# Patient Record
Sex: Female | Born: 1935 | Race: White | Hispanic: No | Smoking: Former smoker
Health system: Southern US, Community
[De-identification: ages and names within clinical notes are randomized; demographics above are authoritative.]

## PROBLEM LIST (undated history)

## (undated) DIAGNOSIS — F03A0 Unspecified dementia, mild, without behavioral disturbance, psychotic disturbance, mood disturbance, and anxiety: Secondary | ICD-10-CM

## (undated) DIAGNOSIS — M1990 Unspecified osteoarthritis, unspecified site: Secondary | ICD-10-CM

## (undated) DIAGNOSIS — Z952 Presence of prosthetic heart valve: Secondary | ICD-10-CM

## (undated) DIAGNOSIS — E785 Hyperlipidemia, unspecified: Secondary | ICD-10-CM

## (undated) DIAGNOSIS — I10 Essential (primary) hypertension: Secondary | ICD-10-CM

## (undated) DIAGNOSIS — E039 Hypothyroidism, unspecified: Secondary | ICD-10-CM

## (undated) DIAGNOSIS — F0390 Unspecified dementia without behavioral disturbance: Secondary | ICD-10-CM

## (undated) HISTORY — PX: ABDOMINAL HYSTERECTOMY: SUR658

## (undated) HISTORY — DX: Essential (primary) hypertension: I10

## (undated) HISTORY — DX: Hyperlipidemia, unspecified: E78.5

## (undated) HISTORY — DX: Unspecified osteoarthritis, unspecified site: M19.90

---

## 2002-11-03 HISTORY — PX: CARDIAC SURGERY: SHX584

## 2017-06-12 ENCOUNTER — Ambulatory Visit (INDEPENDENT_AMBULATORY_CARE_PROVIDER_SITE_OTHER): Payer: PRIVATE HEALTH INSURANCE | Admitting: Urology

## 2017-06-12 ENCOUNTER — Encounter: Payer: Self-pay | Admitting: Urology

## 2017-06-12 VITALS — BP 165/104 | HR 85 | Ht 65.0 in | Wt 120.3 lb

## 2017-06-12 DIAGNOSIS — R339 Retention of urine, unspecified: Secondary | ICD-10-CM | POA: Diagnosis not present

## 2017-06-12 LAB — URINALYSIS, COMPLETE
BILIRUBIN UA: NEGATIVE
GLUCOSE, UA: NEGATIVE
KETONES UA: NEGATIVE
Nitrite, UA: POSITIVE — AB
PH UA: 7 (ref 5.0–7.5)
SPEC GRAV UA: 1.02 (ref 1.005–1.030)
UUROB: 0.2 mg/dL (ref 0.2–1.0)

## 2017-06-12 LAB — MICROSCOPIC EXAMINATION: Epithelial Cells (non renal): NONE SEEN /hpf (ref 0–10)

## 2017-06-12 NOTE — Progress Notes (Signed)
06/12/2017 3:06 PM   Jimmye NormanLarue Rebman 10/31/1921 [id number]  Referring provider: No referring provider defined for this encounter.  CC: Urinary retention  HPI: This is a 83 year old female who recently moved to [place]. She underwent foot surgery April 2018 and was evidently in retention postop. The son is not sure if she's had voiding trials and we don't have any of the records. She is tolerating the Foley well. She has no prior GU history or surgery. NG risk includes neuropathy in her legs and she uses a walker.   Modifying factors: There are no other modifying factors  Associated signs and symptoms: There are no other associated signs and symptoms Aggravating and relieving factors: There are no other aggravating or relieving factors Severity: Moderate Duration: Persistent   PMH: Past Medical History:  Diagnosis Date  . Arthritis   . Hyperlipidemia   . Hypertension     Surgical History: Past Surgical History:  Procedure Laterality Date  . CARDIAC SURGERY      Home Medications:  Allergies as of 06/12/2017      Reactions   Keflex [cephalexin]       Medication List       Accurate as of 06/12/17  3:06 PM. Always use your most recent med list.          acetaminophen 500 MG tablet Commonly known as:  TYLENOL Take 500 mg by mouth every 6 (six) hours as needed.   amLODipine 2.5 MG tablet Commonly known as:  NORVASC Take 2.5 mg by mouth daily.   aspirin EC 81 MG tablet Take 81 mg by mouth daily.   bisacodyl 10 MG suppository Commonly known as:  DULCOLAX Place 10 mg rectally as needed for moderate constipation.   calcium-vitamin D 500-200 MG-UNIT tablet Commonly known as:  OSCAL WITH D Take 1 tablet by mouth.   carvedilol 6.25 MG tablet Commonly known as:  COREG Take 6.25 mg by mouth 2 (two) times daily with a meal.   cyclobenzaprine 5 MG tablet Commonly known as:  FLEXERIL Take 5 mg by mouth 3 (three) times daily as needed for muscle spasms.     DERMACLOUD Crea Apply topically.   ergocalciferol 50000 units capsule Commonly known as:  VITAMIN D2 Take 50,000 Units by mouth once a week.   HYDROcodone-acetaminophen 7.5-325 MG tablet Commonly known as:  NORCO Take 1 tablet by mouth every 6 (six) hours as needed for moderate pain.   levothyroxine 88 MCG tablet Commonly known as:  SYNTHROID, LEVOTHROID Take 88 mcg by mouth daily before breakfast.   magnesium hydroxide 400 MG/5ML suspension Commonly known as:  MILK OF MAGNESIA Take by mouth daily as needed for mild constipation.   Melatonin 3 MG Tabs Take by mouth.   nystatin powder Generic drug:  nystatin Apply topically 4 (four) times daily.   pravastatin 40 MG tablet Commonly known as:  PRAVACHOL Take 40 mg by mouth daily.   senna 8.6 MG tablet Commonly known as:  SENOKOT Take 1 tablet by mouth daily.   vitamin C 500 MG tablet Commonly known as:  ASCORBIC ACID Take 500 mg by mouth daily.       Allergies:  Allergies  Allergen Reactions  . Keflex [Cephalexin]     Family History: Family History  Problem Relation Age of Onset  . Prostate cancer Neg Hx   . Bladder Cancer Neg Hx   . Kidney cancer Neg Hx     Social History:  reports that she has quit smoking. She  has never used smokeless tobacco. She reports that she does not drink alcohol or use drugs.  ROS: UROLOGY Frequent Urination?: Yes Hard to postpone urination?: Yes Burning/pain with urination?: Yes Get up at night to urinate?: Yes Leakage of urine?: No Urine stream starts and stops?: No Trouble starting stream?: No Do you have to strain to urinate?: No Blood in urine?: No Urinary tract infection?: No Sexually transmitted disease?: No Injury to kidneys or bladder?: No Painful intercourse?: No Weak stream?: No Currently pregnant?: No Vaginal bleeding?: No Last menstrual period?: n  Gastrointestinal Nausea?: No Vomiting?: No Indigestion/heartburn?: No Diarrhea?: No Constipation?:  No  Constitutional Fever: No Night sweats?: No Weight loss?: Yes Fatigue?: No  Skin Skin rash/lesions?: No Itching?: No  Eyes Blurred vision?: No Double vision?: No  Ears/Nose/Throat Sore throat?: No Sinus problems?: No  Hematologic/Lymphatic Swollen glands?: No Easy bruising?: Yes  Cardiovascular Leg swelling?: Yes Chest pain?: No  Respiratory Cough?: No Shortness of breath?: No  Endocrine Excessive thirst?: No  Musculoskeletal Back pain?: No Joint pain?: Yes  Neurological Headaches?: No Dizziness?: No  Psychologic Depression?: No Anxiety?: Yes  Physical Exam: BP (!) 165/104 (BP Location: Left Arm, Patient Position: Sitting, Cuff Size: Normal)   Pulse 85   Ht 5\' 5"  (1.651 m)   Wt 54.6 kg (120 lb 4.8 oz)   BMI 20.02 kg/m   Constitutional:  Alert and oriented, No acute distress. HEENT: [place] AT, moist mucus membranes.  Trachea midline, no masses. Cardiovascular: No clubbing, cyanosis, or edema. Respiratory: Normal respiratory effort, no increased work of breathing. GI: Abdomen is soft, nontender, nondistended, no abdominal masses GU: No CVA tenderness. Skin: No rashes, bruises or suspicious lesions. Neurologic: Grossly intact, no focal deficits, moving all 4 extremities. Psychiatric: Normal mood and affect.  Laboratory Data: No results found for: WBC, HGB, HCT, MCV, PLT  No results found for: CREATININE  No results found for: PSA  No results found for: TESTOSTERONE  No results found for: HGBA1C  Urinalysis No results found for: COLORURINE, APPEARANCEUR, LABSPEC, PHURINE, GLUCOSEU, HGBUR, BILIRUBINUR, KETONESUR, PROTEINUR, UROBILINOGEN, NITRITE, LEUKOCYTESUR   Assessment & Plan:    Urinary retention - urine sent for cx so we can plan what abx to give when we do a void trial. We'll see her back in the morning in 4-6 days for a voiding trial. We're trying to get records from [facility].   There are no diagnoses linked to this  encounter.  No Follow-up on file.  Jerilee Field, MD  [facility] [address], [address] [place], [place] [address] [phone]

## 2017-06-15 LAB — CULTURE, URINE COMPREHENSIVE

## 2017-06-16 NOTE — Progress Notes (Signed)
06/17/2017 9:44 AM   Sheri Webster 1920-11-26 [id number]  Referring provider: No referring provider defined for this encounter.  CC: Urinary retention  HPI: This is a 83 year old female who recently moved to [place]. She underwent foot surgery April 2018 and was evidently in retention postop. The son is not sure if she's had voiding trials and we don't have any of the records. She is tolerating the Foley well. She has no prior GU history or surgery. NG risk includes neuropathy in her legs and she uses a walker.   She is here with her son, [name].   Modifying factors: There are no other modifying factors  Associated signs and symptoms: There are no other associated signs and symptoms Aggravating and relieving factors: There are no other aggravating or relieving factors Severity: Moderate Duration: Persistent  Today, she presents for a TOV.   Urine culture from her visit on 06/12/2017 grew out greater than 3 organisms.  She is having nocturia and urinary tract infection as her complaint today.  PMH: Past Medical History:  Diagnosis Date  . Arthritis   . Hyperlipidemia   . Hypertension     Surgical History: Past Surgical History:  Procedure Laterality Date  . ABDOMINAL HYSTERECTOMY    . CARDIAC SURGERY  2004   heart valve replacement    Home Medications:  Allergies as of 06/17/2017      Reactions   Keflex [cephalexin]       Medication List       Accurate as of 06/17/17  9:44 AM. Always use your most recent med list.          acetaminophen 500 MG tablet Commonly known as:  TYLENOL Take 500 mg by mouth every 6 (six) hours as needed.   amLODipine 2.5 MG tablet Commonly known as:  NORVASC Take 2.5 mg by mouth daily.   aspirin EC 81 MG tablet Take 81 mg by mouth daily.   bisacodyl 10 MG suppository Commonly known as:  DULCOLAX Place 10 mg rectally as needed for moderate constipation.   calcium-vitamin D 500-200 MG-UNIT tablet Commonly known as:  OSCAL  WITH D Take 1 tablet by mouth.   carvedilol 6.25 MG tablet Commonly known as:  COREG Take 6.25 mg by mouth 2 (two) times daily with a meal.   cyclobenzaprine 5 MG tablet Commonly known as:  FLEXERIL Take 5 mg by mouth 3 (three) times daily as needed for muscle spasms.   DERMACLOUD Crea Apply topically.   ergocalciferol 50000 units capsule Commonly known as:  VITAMIN D2 Take 50,000 Units by mouth once a week.   HYDROcodone-acetaminophen 7.5-325 MG tablet Commonly known as:  NORCO Take 1 tablet by mouth every 6 (six) hours as needed for moderate pain.   levothyroxine 88 MCG tablet Commonly known as:  SYNTHROID, LEVOTHROID Take 88 mcg by mouth daily before breakfast.   magnesium hydroxide 400 MG/5ML suspension Commonly known as:  MILK OF MAGNESIA Take by mouth daily as needed for mild constipation.   Melatonin 3 MG Tabs Take by mouth.   nystatin powder Generic drug:  nystatin Apply topically 4 (four) times daily.   pravastatin 40 MG tablet Commonly known as:  PRAVACHOL Take 40 mg by mouth daily.   senna 8.6 MG tablet Commonly known as:  SENOKOT Take 1 tablet by mouth daily.   traZODone 50 MG tablet Commonly known as:  DESYREL Take 50 mg by mouth at bedtime.   vitamin C 500 MG tablet Commonly known as:  ASCORBIC  ACID Take 500 mg by mouth daily.       Allergies:  Allergies  Allergen Reactions  . Keflex [Cephalexin]     Family History: Family History  Problem Relation Age of Onset  . Bladder Cancer Neg Hx   . Kidney cancer Neg Hx     Social History:  reports that she quit smoking about 21 years ago. She has never used smokeless tobacco. She reports that she does not drink alcohol or use drugs.  ROS: UROLOGY Frequent Urination?: No Hard to postpone urination?: No Burning/pain with urination?: No Get up at night to urinate?: Yes Leakage of urine?: No Urine stream starts and stops?: No Trouble starting stream?: No Do you have to strain to  urinate?: No Blood in urine?: No Urinary tract infection?: Yes Sexually transmitted disease?: No Injury to kidneys or bladder?: No Painful intercourse?: No Weak stream?: No Currently pregnant?: No Vaginal bleeding?: No Last menstrual period?: n  Gastrointestinal Nausea?: No Vomiting?: No Indigestion/heartburn?: No Diarrhea?: No Constipation?: No  Constitutional Fever: No Night sweats?: No Weight loss?: No Fatigue?: No  Skin Skin rash/lesions?: No Itching?: No  Eyes Blurred vision?: No Double vision?: No  Ears/Nose/Throat Sore throat?: No Sinus problems?: No  Hematologic/Lymphatic Swollen glands?: No Easy bruising?: No  Cardiovascular Leg swelling?: No Chest pain?: No  Respiratory Cough?: No Shortness of breath?: No  Endocrine Excessive thirst?: No  Musculoskeletal Back pain?: No Joint pain?: No  Neurological Headaches?: No Dizziness?: No  Psychologic Depression?: No Anxiety?: No  Physical Exam: BP (!) 167/111   Pulse 69   Ht 5\' 2"  (1.575 m)   Wt 120 lb 4.8 oz (54.6 kg)   BMI 22.00 kg/m   Constitutional:  Alert and oriented, No acute distress. HEENT: [place] AT, moist mucus membranes.  Trachea midline, no masses. Cardiovascular: No clubbing, cyanosis, or edema. Respiratory: Normal respiratory effort, no increased work of breathing. GI: Abdomen is soft, nontender, nondistended, no abdominal masses GU: No CVA tenderness. Skin: No rashes, bruises or suspicious lesions. Neurologic: Grossly intact, no focal deficits, moving all 4 extremities. Psychiatric: Normal mood and affect.  Laboratory Data:  Urinalysis   Assessment & Plan:    Urinary retention - urine sent for cx so we can plan what abx to give when we do a void trial. We'll see her back in the morning in 4-6 days for a voiding trial. We're trying to get records from [facility].   1. Acute urinary retention:     - foley catheter removed  -voiding trial today    -return if unable  to urinate or experiencing suprapubic discomfort  -follow-up in one month for OAB questionnaire and PVR  - We'll request records from [facility]   Return in about 1 month (around 07/18/2017) for PVR and OAB questionnaire.  Michiel CowboySHANNON Webb Weed, PA-C  [facility] [address], [address] [place], [place] [id number] [phone]

## 2017-06-17 ENCOUNTER — Ambulatory Visit (INDEPENDENT_AMBULATORY_CARE_PROVIDER_SITE_OTHER): Payer: PRIVATE HEALTH INSURANCE | Admitting: Urology

## 2017-06-17 ENCOUNTER — Encounter: Payer: Self-pay | Admitting: Urology

## 2017-06-17 VITALS — BP 167/111 | HR 69 | Ht 62.0 in | Wt 120.3 lb

## 2017-06-17 DIAGNOSIS — R339 Retention of urine, unspecified: Secondary | ICD-10-CM

## 2017-06-17 NOTE — Progress Notes (Signed)
Catheter Removal  Patient is present today for a catheter removal.  9ml of water was drained from the balloon. A 16FR foley cath was removed from the bladder no complications were noted . Patient tolerated well.  Preformed by: Dallas Schimkeamona Lakely [place]mendorf CMA  Follow up/ Additional notes: Return to office by 3:00 pm if can not void on own.

## 2017-06-17 NOTE — Progress Notes (Signed)
Medical Release for [facility] received and faxed.

## 2017-06-18 ENCOUNTER — Inpatient Hospital Stay: Payer: Medicare Other

## 2017-06-18 ENCOUNTER — Emergency Department: Payer: Medicare Other

## 2017-06-18 ENCOUNTER — Encounter: Payer: Self-pay | Admitting: Emergency Medicine

## 2017-06-18 ENCOUNTER — Inpatient Hospital Stay
Admission: EM | Admit: 2017-06-18 | Discharge: 2017-06-24 | DRG: 543 | Disposition: A | Discharge status: Skilled Nursing Facility | Payer: Medicare Other | Attending: Internal Medicine | Admitting: Internal Medicine

## 2017-06-18 DIAGNOSIS — Z515 Encounter for palliative care: Secondary | ICD-10-CM

## 2017-06-18 DIAGNOSIS — S0003XA Contusion of scalp, initial encounter: Secondary | ICD-10-CM | POA: Diagnosis present

## 2017-06-18 DIAGNOSIS — I10 Essential (primary) hypertension: Secondary | ICD-10-CM | POA: Diagnosis present

## 2017-06-18 DIAGNOSIS — Z953 Presence of xenogenic heart valve: Secondary | ICD-10-CM | POA: Diagnosis not present

## 2017-06-18 DIAGNOSIS — Z952 Presence of prosthetic heart valve: Secondary | ICD-10-CM | POA: Diagnosis not present

## 2017-06-18 DIAGNOSIS — R339 Retention of urine, unspecified: Secondary | ICD-10-CM | POA: Diagnosis present

## 2017-06-18 DIAGNOSIS — Z66 Do not resuscitate: Secondary | ICD-10-CM | POA: Diagnosis present

## 2017-06-18 DIAGNOSIS — E871 Hypo-osmolality and hyponatremia: Secondary | ICD-10-CM | POA: Diagnosis present

## 2017-06-18 DIAGNOSIS — R55 Syncope and collapse: Secondary | ICD-10-CM | POA: Diagnosis present

## 2017-06-18 DIAGNOSIS — K5900 Constipation, unspecified: Secondary | ICD-10-CM | POA: Diagnosis not present

## 2017-06-18 DIAGNOSIS — I248 Other forms of acute ischemic heart disease: Secondary | ICD-10-CM | POA: Diagnosis present

## 2017-06-18 DIAGNOSIS — W19XXXA Unspecified fall, initial encounter: Secondary | ICD-10-CM | POA: Diagnosis not present

## 2017-06-18 DIAGNOSIS — Z79899 Other long term (current) drug therapy: Secondary | ICD-10-CM | POA: Diagnosis not present

## 2017-06-18 DIAGNOSIS — M549 Dorsalgia, unspecified: Secondary | ICD-10-CM

## 2017-06-18 DIAGNOSIS — W06XXXA Fall from bed, initial encounter: Secondary | ICD-10-CM | POA: Diagnosis present

## 2017-06-18 DIAGNOSIS — E039 Hypothyroidism, unspecified: Secondary | ICD-10-CM | POA: Diagnosis present

## 2017-06-18 DIAGNOSIS — R748 Abnormal levels of other serum enzymes: Secondary | ICD-10-CM | POA: Diagnosis not present

## 2017-06-18 DIAGNOSIS — Z87891 Personal history of nicotine dependence: Secondary | ICD-10-CM

## 2017-06-18 DIAGNOSIS — E785 Hyperlipidemia, unspecified: Secondary | ICD-10-CM | POA: Diagnosis present

## 2017-06-18 DIAGNOSIS — I4891 Unspecified atrial fibrillation: Secondary | ICD-10-CM | POA: Diagnosis not present

## 2017-06-18 DIAGNOSIS — E860 Dehydration: Secondary | ICD-10-CM | POA: Diagnosis present

## 2017-06-18 DIAGNOSIS — Z7189 Other specified counseling: Secondary | ICD-10-CM | POA: Diagnosis not present

## 2017-06-18 DIAGNOSIS — R7989 Other specified abnormal findings of blood chemistry: Secondary | ICD-10-CM

## 2017-06-18 DIAGNOSIS — E876 Hypokalemia: Secondary | ICD-10-CM | POA: Diagnosis not present

## 2017-06-18 DIAGNOSIS — M545 Low back pain: Secondary | ICD-10-CM | POA: Diagnosis not present

## 2017-06-18 DIAGNOSIS — S0990XA Unspecified injury of head, initial encounter: Secondary | ICD-10-CM | POA: Diagnosis not present

## 2017-06-18 DIAGNOSIS — I351 Nonrheumatic aortic (valve) insufficiency: Secondary | ICD-10-CM | POA: Diagnosis not present

## 2017-06-18 DIAGNOSIS — R778 Other specified abnormalities of plasma proteins: Secondary | ICD-10-CM

## 2017-06-18 DIAGNOSIS — M4856XA Collapsed vertebra, not elsewhere classified, lumbar region, initial encounter for fracture: Principal | ICD-10-CM | POA: Diagnosis present

## 2017-06-18 DIAGNOSIS — M546 Pain in thoracic spine: Secondary | ICD-10-CM | POA: Diagnosis present

## 2017-06-18 DIAGNOSIS — M4854XA Collapsed vertebra, not elsewhere classified, thoracic region, initial encounter for fracture: Secondary | ICD-10-CM | POA: Diagnosis present

## 2017-06-18 LAB — BASIC METABOLIC PANEL
Anion gap: 11 (ref 5–15)
BUN: 14 mg/dL (ref 6–20)
CALCIUM: 9.5 mg/dL (ref 8.9–10.3)
CO2: 27 mmol/L (ref 22–32)
CREATININE: 0.69 mg/dL (ref 0.44–1.00)
Chloride: 91 mmol/L — ABNORMAL LOW (ref 101–111)
Glucose, Bld: 119 mg/dL — ABNORMAL HIGH (ref 65–99)
Potassium: 4.1 mmol/L (ref 3.5–5.1)
SODIUM: 129 mmol/L — AB (ref 135–145)

## 2017-06-18 LAB — CBC WITH DIFFERENTIAL/PLATELET
BASOS ABS: 0.1 10*3/uL (ref 0–0.1)
BASOS PCT: 1 %
Eosinophils Absolute: 0 10*3/uL (ref 0–0.7)
Eosinophils Relative: 0 %
HEMATOCRIT: 40.9 % (ref 35.0–47.0)
Hemoglobin: 14.4 g/dL (ref 12.0–16.0)
LYMPHS PCT: 4 %
Lymphs Abs: 0.6 10*3/uL — ABNORMAL LOW (ref 1.0–3.6)
MCH: 33.6 pg (ref 26.0–34.0)
MCHC: 35.1 g/dL (ref 32.0–36.0)
MCV: 95.6 fL (ref 80.0–100.0)
MONO ABS: 0.8 10*3/uL (ref 0.2–0.9)
Monocytes Relative: 5 %
NEUTROS ABS: 14.3 10*3/uL — AB (ref 1.4–6.5)
Neutrophils Relative %: 90 %
PLATELETS: 305 10*3/uL (ref 150–440)
RBC: 4.28 MIL/uL (ref 3.80–5.20)
RDW: 14.9 % — AB (ref 11.5–14.5)
WBC: 15.8 10*3/uL — AB (ref 3.6–11.0)

## 2017-06-18 LAB — URINALYSIS, COMPLETE (UACMP) WITH MICROSCOPIC
BILIRUBIN URINE: NEGATIVE
GLUCOSE, UA: NEGATIVE mg/dL
KETONES UR: NEGATIVE mg/dL
LEUKOCYTES UA: NEGATIVE
NITRITE: NEGATIVE
PH: 6 (ref 5.0–8.0)
PROTEIN: NEGATIVE mg/dL
SQUAMOUS EPITHELIAL / LPF: NONE SEEN
Specific Gravity, Urine: 1.002 — ABNORMAL LOW (ref 1.005–1.030)

## 2017-06-18 LAB — HEPATIC FUNCTION PANEL
ALBUMIN: 4.5 g/dL (ref 3.5–5.0)
ALT: 13 U/L — ABNORMAL LOW (ref 14–54)
AST: 34 U/L (ref 15–41)
Alkaline Phosphatase: 63 U/L (ref 38–126)
BILIRUBIN INDIRECT: 1.5 mg/dL — AB (ref 0.3–0.9)
Bilirubin, Direct: 0.5 mg/dL (ref 0.1–0.5)
TOTAL PROTEIN: 7.8 g/dL (ref 6.5–8.1)
Total Bilirubin: 2 mg/dL — ABNORMAL HIGH (ref 0.3–1.2)

## 2017-06-18 LAB — TROPONIN I
TROPONIN I: 0.04 ng/mL — AB (ref <0.03)
TROPONIN I: 0.04 ng/mL — AB (ref <0.03)

## 2017-06-18 MED ORDER — ASPIRIN EC 81 MG PO TBEC
81.0000 mg | DELAYED_RELEASE_TABLET | Freq: Two times a day (BID) | ORAL | Status: DC
  Administered 2017-06-18 – 2017-06-24 (×12): 81 mg via ORAL
  Filled 2017-06-18 (×12): qty 1

## 2017-06-18 MED ORDER — ACETAMINOPHEN 650 MG RE SUPP: 650.0000 mg | Freq: Four times a day (QID) | RECTAL | Status: DC | PRN

## 2017-06-18 MED ORDER — ONDANSETRON HCL 4 MG/2ML IJ SOLN: 4.0000 mg | Freq: Four times a day (QID) | INTRAMUSCULAR | Status: DC | PRN

## 2017-06-18 MED ORDER — ENOXAPARIN SODIUM 40 MG/0.4ML ~~LOC~~ SOLN
40.0000 mg | SUBCUTANEOUS | Status: DC
  Administered 2017-06-18 – 2017-06-23 (×6): 40 mg via SUBCUTANEOUS
  Filled 2017-06-18 (×6): qty 0.4

## 2017-06-18 MED ORDER — TRAZODONE HCL 50 MG PO TABS
25.0000 mg | ORAL_TABLET | Freq: Every day | ORAL | Status: DC
  Administered 2017-06-18 – 2017-06-23 (×6): 25 mg via ORAL
  Filled 2017-06-18 (×6): qty 1

## 2017-06-18 MED ORDER — DOCUSATE SODIUM 100 MG PO CAPS
100.0000 mg | ORAL_CAPSULE | Freq: Two times a day (BID) | ORAL | Status: DC
  Administered 2017-06-18 – 2017-06-24 (×12): 100 mg via ORAL
  Filled 2017-06-18 (×12): qty 1

## 2017-06-18 MED ORDER — AMLODIPINE BESYLATE 5 MG PO TABS
2.5000 mg | ORAL_TABLET | Freq: Every day | ORAL | Status: DC
  Administered 2017-06-19 – 2017-06-20 (×2): 2.5 mg via ORAL
  Filled 2017-06-18 (×2): qty 1

## 2017-06-18 MED ORDER — BISACODYL 5 MG PO TBEC
5.0000 mg | DELAYED_RELEASE_TABLET | Freq: Every day | ORAL | Status: DC | PRN
  Administered 2017-06-20 – 2017-06-23 (×2): 5 mg via ORAL
  Filled 2017-06-18 (×2): qty 1

## 2017-06-18 MED ORDER — ACETAMINOPHEN 325 MG PO TABS
650.0000 mg | ORAL_TABLET | Freq: Four times a day (QID) | ORAL | Status: DC | PRN
  Administered 2017-06-18 – 2017-06-19 (×3): 650 mg via ORAL
  Filled 2017-06-18 (×3): qty 2

## 2017-06-18 MED ORDER — PRAVASTATIN SODIUM 40 MG PO TABS
40.0000 mg | ORAL_TABLET | Freq: Every day | ORAL | Status: DC
Start: 2017-06-19 — End: 2017-06-24
  Administered 2017-06-19 – 2017-06-23 (×5): 40 mg via ORAL
  Filled 2017-06-18 (×5): qty 1

## 2017-06-18 MED ORDER — CARVEDILOL 6.25 MG PO TABS
6.2500 mg | ORAL_TABLET | Freq: Two times a day (BID) | ORAL | Status: DC
  Administered 2017-06-18 – 2017-06-20 (×4): 6.25 mg via ORAL
  Filled 2017-06-18 (×4): qty 1

## 2017-06-18 MED ORDER — SODIUM CHLORIDE 0.9 % IV SOLN
INTRAVENOUS | Status: DC
  Administered 2017-06-18 – 2017-06-19 (×2): via INTRAVENOUS

## 2017-06-18 MED ORDER — SODIUM CHLORIDE 0.9 % IV BOLUS (SEPSIS)
1000.0000 mL | Freq: Once | INTRAVENOUS | Status: AC
  Administered 2017-06-18: 1000 mL via INTRAVENOUS

## 2017-06-18 MED ORDER — ONDANSETRON HCL 4 MG PO TABS: 4.0000 mg | ORAL_TABLET | Freq: Four times a day (QID) | ORAL | Status: DC | PRN

## 2017-06-18 MED ORDER — LEVOTHYROXINE SODIUM 88 MCG PO TABS
88.0000 ug | ORAL_TABLET | Freq: Every day | ORAL | Status: DC
  Administered 2017-06-19 – 2017-06-24 (×6): 88 ug via ORAL
  Filled 2017-06-18 (×6): qty 1

## 2017-06-18 NOTE — ED Notes (Signed)
Patient transported to CT 

## 2017-06-18 NOTE — ED Notes (Signed)
Patient takes a daily asa.

## 2017-06-18 NOTE — ED Notes (Signed)
In and out cath completed by this RN. Assisted by Maralyn SagoSarah, Medic Student. Sterile technique maintained. Patient tolerated well.

## 2017-06-18 NOTE — ED Triage Notes (Addendum)
Patient presents to ED via [facility] from [facility] post fall. Patient states she was getting out of bed to go to the bathroom when she fell. Patient unable to state what made her fall or if she lost consciousness. Patient neurologically at baseline. History of dementia. Hematoma noted to front and back of head. Patient c/o neck tenderness. C collar on.

## 2017-06-18 NOTE — ED Provider Notes (Signed)
[facility] Emergency Department Provider Note  ____________________________________________   First MD Initiated Contact with Patient 06/18/17 1329     (approximate)  I have reviewed the triage vital signs and the nursing notes.   HISTORY  Chief Complaint Fall  Level V exemption history Limited by the patient's dementia  HPI Sheri Webster is a 83 y.o. female who comes to the emergency department via EMS after a fall. The patient says she was walking with her walker in the next thing she knew she was on the ground with a headache and pain in her neck. She is not sure if she had chest pain or palpitations. She's not sure if she takes any blood thinning medications. Her pain right now is moderate severity aching in her neck nothing seems to make it better or worse. She arrives in a cervical collar.   Past Medical History:  Diagnosis Date  . Arthritis   . Hyperlipidemia   . Hypertension     There are no active problems to display for this patient.   Past Surgical History:  Procedure Laterality Date  . ABDOMINAL HYSTERECTOMY    . CARDIAC SURGERY  2004   heart valve replacement    Prior to Admission medications   Medication Sig Start Date End Date Taking? Authorizing Provider  acetaminophen (TYLENOL) 500 MG tablet Take 500 mg by mouth every 6 (six) hours as needed.   Yes [provider]  amLODipine (NORVASC) 2.5 MG tablet Take 2.5 mg by mouth daily.   Yes [provider]  aspirin EC 81 MG tablet Take 81 mg by mouth 2 (two) times daily.    Yes [provider]  bisacodyl (DULCOLAX) 10 MG suppository Place 10 mg rectally as needed for moderate constipation.   Yes [provider]  calcium-vitamin D (OSCAL WITH D) 500-200 MG-UNIT tablet Take 1 tablet by mouth 2 (two) times daily.    Yes [provider]  carvedilol (COREG) 6.25 MG tablet Take 6.25 mg by mouth 2 (two) times daily with a meal.   Yes [provider]  cyclobenzaprine (FLEXERIL) 5 MG tablet Take 5 mg by mouth 3 (three) times daily as needed for muscle spasms.   Yes [provider]  ergocalciferol (VITAMIN D2) 50000 units capsule Take 50,000 Units by mouth once a week.   Yes [provider]  [employer] (DERMACLOUD) CREA Apply topically.   Yes [provider]  levothyroxine (SYNTHROID, LEVOTHROID) 88 MCG tablet Take 88 mcg by mouth daily before breakfast.   Yes [provider]  magnesium hydroxide (MILK OF MAGNESIA) 400 MG/5ML suspension Take by mouth daily as needed for mild constipation.   Yes [provider]  Melatonin 3 MG TABS Take 6 mg by mouth at bedtime.    Yes [provider]  nystatin (NYSTATIN) powder Apply topically 2 (two) times daily.    Yes [provider]  pravastatin (PRAVACHOL) 40 MG tablet Take 40 mg by mouth daily.   Yes [provider]  senna (SENOKOT) 8.6 MG tablet Take 2 tablets by mouth 2 (two) times daily.    Yes [provider]  traZODone (DESYREL) 50 MG tablet Take 25 mg by mouth at bedtime.    Yes [provider]    Allergies Keflex [cephalexin]  Family History  Problem Relation Age of Onset  . Bladder Cancer Neg Hx   . Kidney cancer Neg Hx     Social History Social History  Substance  Use Topics  . Smoking status: Former Smoker    Quit date: 11/04/1995  . Smokeless tobacco: Never Used  . Alcohol use No    Review of Systems Level V exemption history Limited by the patient's dementia ____________________________________________   PHYSICAL EXAM:  VITAL SIGNS: ED Triage Vitals  Enc Vitals Group     BP 06/18/17 1323 (!) 188/79     Pulse Rate 06/18/17 1323 83     Resp 06/18/17 1323 16     Temp 06/18/17 1323 98.4 F (36.9 C)     Temp Source 06/18/17 1323 Oral     SpO2 06/18/17 1323 99 %     Weight 06/18/17 1323 135 lb (61.2 kg)     Height 06/18/17 1323 5\' 4"  (1.626 m)     Head  Circumference --      Peak Flow --      Pain Score 06/18/17 1322 6     Pain Loc --      Pain Edu? --      Excl. in GC? --     Constitutional: Pleasant cooperative significant dementia and his cervical collar Eyes: PERRL EOMI. Head: Hematoma to right forehead. Nose: No congestion/rhinnorhea. Mouth/Throat: No trismus Neck: No stridor.  No midline tenderness or step-offs Cardiovascular: Irregularly irregular but normal rate Respiratory: Normal respiratory effort.  No retractions. Lungs CTAB and moving good air Gastrointestinal: Soft nontender Musculoskeletal: No lower extremity edema   Neurologic:  No gross neurological issues noted Moves all 4 feels all 4 Skin:  Skin is warm, dry and intact. No rash noted. Psychiatric: Moderate dementia  ____________________________________________   DIFFERENTIAL includes but not limited to  Cardiogenic security, vasovagal syncope, mechanical fall, intracerebral hemorrhage, cervical spine fracture, sepsis, urinary tract infection, metabolic. ____________________________________________   LABS (all labs ordered are listed, but only abnormal results are displayed)  Labs Reviewed  BASIC METABOLIC PANEL - Abnormal; Notable for the following:       Result Value   Sodium 129 (*)    Chloride 91 (*)    Glucose, Bld 119 (*)    All other components within normal limits  HEPATIC FUNCTION PANEL - Abnormal; Notable for the following:    ALT 13 (*)    Total Bilirubin 2.0 (*)    Indirect Bilirubin 1.5 (*)    All other components within normal limits  TROPONIN I - Abnormal; Notable for the following:    Troponin I 0.04 (*)    All other components within normal limits  CBC WITH DIFFERENTIAL/PLATELET - Abnormal; Notable for the following:    WBC 15.8 (*)    RDW 14.9 (*)    Neutro Abs 14.3 (*)    Lymphs Abs 0.6 (*)    All other components within normal limits  URINALYSIS, COMPLETE (UACMP) WITH MICROSCOPIC - Abnormal; Notable for the following:     Color, Urine STRAW (*)    APPearance CLEAR (*)    Specific Gravity, Urine 1.002 (*)    Hgb urine dipstick SMALL (*)    Bacteria, UA FEW (*)    All other components within normal limits    Slight hyponatremia elevated troponin of unclear clinical significance and likely represents demand and not true primary myocardial ischemia __________________________________________  EKG  ED ECG REPORT I, Merrily BrittleNeil Shynia Daleo, the attending physician, personally viewed and interpreted this ECG.  Date: 06/18/2017 Rate: 82 Rhythm: Atrial fibrillation with regular rate QRS Axis: normal Intervals: normal ST/T Wave abnormalities: normal Narrative Interpretation: Abnormal  ____________________________________________  RADIOLOGY  Head  and C-spine CT with no acute disease ____________________________________________   PROCEDURES  Procedure(s) performed: no  Procedures  Critical Care performed: no  Observation: no ____________________________________________   INITIAL IMPRESSION / ASSESSMENT AND PLAN / ED COURSE  Pertinent labs & imaging results that were available during my care of the patient were reviewed by me and considered in my medical decision making (see chart for details).  The patient arrives with a clear left forehead trauma after a possible syncope versus fall. Imaging and labs are pending.     ----------------------------------------- 2:21 PM on 06/18/2017 -----------------------------------------  Moving all 4 feels all 4 with a negative CT scan with recons. Cervical collar removed. ____________________________________________   The patient's EKG shows new onset atrial fibrillation and she does have a bumped troponin. I'm concerned she may have had a cardiogenic event and she is dehydrated. She requires IV hydration and inpatient admission for IV fluids and telemetry.  FINAL CLINICAL IMPRESSION(S) / ED DIAGNOSES  Final diagnoses:  Fall, initial encounter  Injury  of head, initial encounter  Syncope, unspecified syncope type  Atrial fibrillation, unspecified type (HCC)  Dehydration  Elevated troponin      NEW MEDICATIONS STARTED DURING THIS VISIT:  New Prescriptions   No medications on file     Note:  This document was prepared using Dragon voice recognition software and may include unintentional dictation errors.     Merrily Brittle, MD 06/18/17 1527

## 2017-06-18 NOTE — H&P (Signed)
[facility] - [place] at [facility]   PATIENT NAME: Sheri Webster    MR#:  [id number]  DATE OF BIRTH:  [birth year]  DATE OF ADMISSION:  06/18/2017  PRIMARY CARE PHYSICIAN: Patient, No Pcp Per   REQUESTING/REFERRING PHYSICIAN: Dr. Lamont Snowball  CHIEF COMPLAINT: Syncope    Chief Complaint  Patient presents with  . Fall    HISTORY OF PRESENT ILLNESS:  Sheri Webster  is a 83 y.o. female with a known history ofHypertension hypertension, who lives at [facility] assisted [facility] had a fall when she was going to bathroom today morning. Did not lose consciousness. Patient hit her head and  Landed on back. CT of the head, CT neck unremarkable.  found to have Hyponatremia. History of uterine retention and patient seeing urology, her Foley catheter was removed yesterday.Patient had postoperative urinary retention after her foot surgery in April patient had urinary retention since then. . Foley catheter was taken out yesterday and urology office. No fever. Her main complaint today is lower back pain. Some found to have a bump on her right  side of fore head.  PAST MEDICAL HISTORY:   Past Medical History:  Diagnosis Date  . Arthritis   . Hyperlipidemia   . Hypertension     PAST SURGICAL HISTOIRY:   Past Surgical History:  Procedure Laterality Date  . ABDOMINAL HYSTERECTOMY    . CARDIAC SURGERY  2004   heart valve replacement    SOCIAL HISTORY:   Social History  Substance Use Topics  . Smoking status: Former Smoker    Quit date: 11/04/1995  . Smokeless tobacco: Never Used  . Alcohol use No    FAMILY HISTORY:   Family History  Problem Relation Age of Onset  . Bladder Cancer Neg Hx   . Kidney cancer Neg Hx     DRUG ALLERGIES:   Allergies  Allergen Reactions  . Keflex [Cephalexin]     REVIEW OF SYSTEMS:  CONSTITUTIONAL: No fever, fatigue or weakness.  EYES: No blurred or double vision.  EARS, NOSE, AND THROAT: No tinnitus or ear pain.  RESPIRATORY: No  cough, shortness of breath, wheezing or hemoptysis.  CARDIOVASCULAR: No chest pain, orthopnea, edema.  GASTROINTESTINAL: No nausea, vomiting, diarrhea or abdominal pain.  GENITOURINARY: No dysuria, hematuria.  ENDOCRINE: No polyuria, nocturia,  HEMATOLOGY: No anemia, easy bruising or bleeding SKIN: No rash or lesion. MUSCULOSKELETAL: No joint pain or arthritis.   NEUROLOGIC: No tingling, numbness, weakness.  PSYCHIATRY: No anxiety or depression.   MEDICATIONS AT HOME:   Prior to Admission medications   Medication Sig Start Date End Date Taking? Authorizing Provider  acetaminophen (TYLENOL) 500 MG tablet Take 500 mg by mouth every 6 (six) hours as needed.   Yes [provider]  amLODipine (NORVASC) 2.5 MG tablet Take 2.5 mg by mouth daily.   Yes [provider]  aspirin EC 81 MG tablet Take 81 mg by mouth 2 (two) times daily.    Yes [provider]  bisacodyl (DULCOLAX) 10 MG suppository Place 10 mg rectally as needed for moderate constipation.   Yes [provider]  calcium-vitamin D (OSCAL WITH D) 500-200 MG-UNIT tablet Take 1 tablet by mouth 2 (two) times daily.    Yes [provider]  carvedilol (COREG) 6.25 MG tablet Take 6.25 mg by mouth 2 (two) times daily with a meal.   Yes [provider]  cyclobenzaprine (FLEXERIL) 5 MG tablet Take 5 mg by mouth 3 (three) times daily as needed for  muscle spasms.   Yes [provider]  ergocalciferol (VITAMIN D2) 50000 units capsule Take 50,000 Units by mouth once a week.   Yes [provider]  Infant Care Products (DERMACLOUD) CREA Apply topically.   Yes [provider]  levothyroxine (SYNTHROID, LEVOTHROID) 88 MCG tablet Take 88 mcg by mouth daily before breakfast.   Yes [provider]  magnesium hydroxide (MILK OF MAGNESIA) 400 MG/5ML suspension Take by mouth daily as needed for mild constipation.   Yes [provider]  Melatonin 3 MG TABS Take 6  mg by mouth at bedtime.    Yes [provider]  nystatin (NYSTATIN) powder Apply topically 2 (two) times daily.    Yes [provider]  pravastatin (PRAVACHOL) 40 MG tablet Take 40 mg by mouth daily.   Yes [provider]  senna (SENOKOT) 8.6 MG tablet Take 2 tablets by mouth 2 (two) times daily.    Yes [provider]  traZODone (DESYREL) 50 MG tablet Take 25 mg by mouth at bedtime.    Yes [provider]      VITAL SIGNS:  Blood pressure (!) 173/92, pulse 80, temperature 98.4 F (36.9 C), temperature source Oral, resp. rate 16, height 5\' 4"  (1.626 m), weight 61.2 kg (135 lb), SpO2 96 %.  PHYSICAL EXAMINATION:  GENERAL:  83 y.o.-year-old patient lying in the bed with no acute distress.  EYES: Pupils equal, round, reactive to light and accommodation. No scleral icterus. Extraocular muscles intact.  HEENT: soft bump on fore head on right side.normocephalic. Oropharynx and nasopharynx clear.  NECK:  Supple, no jugular venous distention. No thyroid enlargement, no tenderness.  LUNGS: Normal breath sounds bilaterally, no wheezing, rales,rhonchi or crepitation. No use of accessory muscles of respiration.  CARDIOVASCULAR: S1, S2 normal. No murmurs, rubs, or gallops.  ABDOMEN: Soft, nontender, nondistended. Bowel sounds present. No organomegaly or mass.  EXTREMITIES: No pedal edema, cyanosis, or clubbing.  NEUROLOGIC: Cranial nerves II through XII are intact. Muscle strength 5/5 in all extremities. Sensation intact. Gait not checked.  PSYCHIATRIC: The patient is alert and oriented x 3.  SKIN: No obvious rash, lesion, or ulcer.   LABORATORY PANEL:   CBC  Recent Labs Lab 06/18/17 1427  WBC 15.8*  HGB 14.4  HCT 40.9  PLT 305   ------------------------------------------------------------------------------------------------------------------  Chemistries   Recent Labs Lab 06/18/17 1427  NA 129*  K 4.1  CL 91*  CO2 27  GLUCOSE 119*  BUN  14  CREATININE 0.69  CALCIUM 9.5  AST 34  ALT 13*  ALKPHOS 63  BILITOT 2.0*   ------------------------------------------------------------------------------------------------------------------  Cardiac Enzymes  Recent Labs Lab 06/18/17 1427  TROPONINI 0.04*   ------------------------------------------------------------------------------------------------------------------  RADIOLOGY:  Dg Chest 1 View  Result Date: 06/18/2017 CLINICAL DATA:  Status post fall.  Back pain. EXAM: CHEST 1 VIEW COMPARISON:  None. FINDINGS: The heart size and mediastinal contours are within normal limits. Prior TAVR. Both lungs are clear. The visualized skeletal structures are unremarkable. IMPRESSION: No active disease. Electronically Signed   By: Elige KoHetal  Patel   On: 06/18/2017 14:56   Ct Head Wo Contrast  Result Date: 06/18/2017 CLINICAL DATA:  Getting out of bit head. Fall. Possible loss of consciousness. History of dementia. Soft tissue hematoma. EXAM: CT HEAD WITHOUT CONTRAST CT CERVICAL SPINE WITHOUT CONTRAST TECHNIQUE: Multidetector CT imaging of the head and cervical spine was performed following the standard protocol without intravenous contrast. Multiplanar CT image reconstructions of the cervical spine were also generated. COMPARISON:  None.  FINDINGS: CT HEAD FINDINGS Brain: Generalized atrophy. Chronic small-vessel ischemic changes of the cerebral hemispheric white matter. No evidence of acute or subacute infarction, mass lesion, hemorrhage, hydrocephalus or extra-axial collection. Vascular: There is atherosclerotic calcification of the major vessels at the base of the brain. Skull: No skull fracture. Sinuses/Orbits: Clear/normal Other: Right frontal scalp hematoma. CT CERVICAL SPINE FINDINGS Alignment: Normal Skull base and vertebrae: No acute or traumatic finding. Soft tissues and spinal canal: No significant soft tissue finding. Ordinary carotid artery atherosclerosis. Disc levels: Mild spondylosis  C3-4 and C5-6. Mild facet osteoarthritis right C2-3 and C3-4 and left C3-4 and C4-5. Upper chest: Benign appearing pleural and parenchymal apical scarring. Other: None IMPRESSION: Head CT: No acute or traumatic intracranial finding. Atrophy and chronic small vessel disease. No skull fracture. Right frontal scalp hematoma. Cervical spine CT: No acute or traumatic finding. Mild degenerative changes for age. Electronically Signed   By: Paulina Fusi M.D.   On: 06/18/2017 14:05   Ct Cervical Spine Wo Contrast  Result Date: 06/18/2017 CLINICAL DATA:  Getting out of bit head. Fall. Possible loss of consciousness. History of dementia. Soft tissue hematoma. EXAM: CT HEAD WITHOUT CONTRAST CT CERVICAL SPINE WITHOUT CONTRAST TECHNIQUE: Multidetector CT imaging of the head and cervical spine was performed following the standard protocol without intravenous contrast. Multiplanar CT image reconstructions of the cervical spine were also generated. COMPARISON:  None. FINDINGS: CT HEAD FINDINGS Brain: Generalized atrophy. Chronic small-vessel ischemic changes of the cerebral hemispheric white matter. No evidence of acute or subacute infarction, mass lesion, hemorrhage, hydrocephalus or extra-axial collection. Vascular: There is atherosclerotic calcification of the major vessels at the base of the brain. Skull: No skull fracture. Sinuses/Orbits: Clear/normal Other: Right frontal scalp hematoma. CT CERVICAL SPINE FINDINGS Alignment: Normal Skull base and vertebrae: No acute or traumatic finding. Soft tissues and spinal canal: No significant soft tissue finding. Ordinary carotid artery atherosclerosis. Disc levels: Mild spondylosis C3-4 and C5-6. Mild facet osteoarthritis right C2-3 and C3-4 and left C3-4 and C4-5. Upper chest: Benign appearing pleural and parenchymal apical scarring. Other: None IMPRESSION: Head CT: No acute or traumatic intracranial finding. Atrophy and chronic small vessel disease. No skull fracture. Right  frontal scalp hematoma. Cervical spine CT: No acute or traumatic finding. Mild degenerative changes for age. Electronically Signed   By: Paulina Fusi M.D.   On: 06/18/2017 14:05    EKG:   Atrial fibrillation with 82 bpm  Impression and plan: #1 .syncope likely due to dehydration. Admit to telemetry, start gentle hydration. Head CT unremarkable. Check orthostatic vitals, follow echocardiogram, ultrasound of carotids. Patient had urine retention after surgery in April and Foley catheter was taken out recently and patient was having urgency to urinate and she had to go to bathroom by herself and then she had a fall and hit her head and landed on back on the way to bathroom today. Hyponatremia due to dehydration: Continue gentle  hydration, check sodium tomorrow.   #2 slightly elevated troponins, new onset atrial fibrillation: Likely secondary to dehydration. Cycle troponins, follow echocardiogram, monitor on telemetry. If atrial fibrillation persists up in cardiology consult. Now the rate is controlled with 80 bpm.  #3 essential hypertension: Controlled, continue home dose medicines with Norvasc, Coreg .  #4. Low back pain likely due to fall. X-ray of lower back.. Range of motion at hip joints and  Knee joints is normal.  Hypothyroidism: Continue levothyroxine.    5 history of urinary retention, Foley was removed yesterday by urology, please do  bladder scans if patient is not able to void. Follow the urine cultures that were done at urology office yesterday. UA is normal today.    All the records are reviewed and case discussed with ED provider. Management plans discussed with the patient, family and they are in agreement.  CODE STATUS: Full code   TOTAL TIME TAKING CARE OF THIS PATIENT: 55 min   Shaneika Rossa M.D on 06/18/2017 at 4:10 PM  Between 7am to 6pm - Pager - [phone]  After 6pm go to [email or web address] - password [email or web address]  [facility]  Office   [phone]  CC: Primary care physician; Patient, No Pcp Per  Note: This dictation was prepared with Dragon dictation along with smaller phrase technology. Any transcriptional errors that result from this process are unintentional.

## 2017-06-19 ENCOUNTER — Inpatient Hospital Stay: Payer: Medicare Other

## 2017-06-19 ENCOUNTER — Inpatient Hospital Stay (HOSPITAL_COMMUNITY)
Admit: 2017-06-19 | Discharge: 2017-06-19 | Disposition: A | Discharge status: Home / Self Care | Payer: Medicare Other | Attending: Internal Medicine | Admitting: Internal Medicine

## 2017-06-19 DIAGNOSIS — I351 Nonrheumatic aortic (valve) insufficiency: Secondary | ICD-10-CM

## 2017-06-19 LAB — CBC
HEMATOCRIT: 34.8 % — AB (ref 35.0–47.0)
HEMOGLOBIN: 12.2 g/dL (ref 12.0–16.0)
MCH: 34.5 pg — ABNORMAL HIGH (ref 26.0–34.0)
MCHC: 35.1 g/dL (ref 32.0–36.0)
MCV: 98.2 fL (ref 80.0–100.0)
Platelets: 219 10*3/uL (ref 150–440)
RBC: 3.54 MIL/uL — ABNORMAL LOW (ref 3.80–5.20)
RDW: 14.2 % (ref 11.5–14.5)
WBC: 10.5 10*3/uL (ref 3.6–11.0)

## 2017-06-19 LAB — TROPONIN I
TROPONIN I: 0.11 ng/mL — AB (ref <0.03)
Troponin I: 0.05 ng/mL (ref <0.03)
Troponin I: <0.03 ng/mL (ref <0.03)
Troponin I: 0.03 ng/mL (ref <0.03)

## 2017-06-19 LAB — MAGNESIUM: Magnesium: 2 mg/dL (ref 1.7–2.4)

## 2017-06-19 LAB — BASIC METABOLIC PANEL
Anion gap: 8 (ref 5–15)
BUN: 14 mg/dL (ref 6–20)
CO2: 28 mmol/L (ref 22–32)
Calcium: 8.4 mg/dL — ABNORMAL LOW (ref 8.9–10.3)
Chloride: 96 mmol/L — ABNORMAL LOW (ref 101–111)
Creatinine, Ser: 0.72 mg/dL (ref 0.44–1.00)
GFR calc Af Amer: >60 mL/min (ref >60)
GLUCOSE: 117 mg/dL — AB (ref 65–99)
Potassium: 2.9 mmol/L — ABNORMAL LOW (ref 3.5–5.1)
SODIUM: 132 mmol/L — AB (ref 135–145)

## 2017-06-19 LAB — ECHOCARDIOGRAM COMPLETE
HEIGHTINCHES: 64 in
Weight: 2014.4 oz

## 2017-06-19 LAB — MRSA PCR SCREENING: MRSA by PCR: NEGATIVE

## 2017-06-19 MED ORDER — TRAMADOL HCL 50 MG PO TABS
50.0000 mg | ORAL_TABLET | Freq: Four times a day (QID) | ORAL | Status: DC | PRN
  Administered 2017-06-20 – 2017-06-23 (×3): 50 mg via ORAL
  Filled 2017-06-19 (×3): qty 1

## 2017-06-19 MED ORDER — POTASSIUM CHLORIDE 20 MEQ PO PACK
40.0000 meq | PACK | ORAL | Status: AC
  Administered 2017-06-19 (×3): 40 meq via ORAL
  Filled 2017-06-19 (×3): qty 2

## 2017-06-19 MED ORDER — FENTANYL 25 MCG/HR TD PT72
25.0000 ug | MEDICATED_PATCH | TRANSDERMAL | Status: DC
  Administered 2017-06-19 – 2017-06-22 (×2): 25 ug via TRANSDERMAL
  Filled 2017-06-19 (×2): qty 1

## 2017-06-19 MED ORDER — POTASSIUM CHLORIDE CRYS ER 20 MEQ PO TBCR: 40.0000 meq | EXTENDED_RELEASE_TABLET | Freq: Once | ORAL | Status: DC

## 2017-06-19 MED ORDER — MORPHINE SULFATE (PF) 2 MG/ML IV SOLN: 2.0000 mg | INTRAVENOUS | Status: DC | PRN

## 2017-06-19 NOTE — Consult Note (Signed)
Patient is a 83 year old who suffered a fall yesterday. She was having significant low back pain. She'll be getting progressively worse over the last several months and recently moved to [facility] previously living down in [place]. She is complaining of back pain all along her back but is particularly tender and the low back lumbosacral junction with tenderness to percussion. She had a CT scan that shows an L5 superior endplate compression fracture. She is able flex extend her toes and denies numbness or tingling  Impression is L5 compression fracture Recommendation: Recommend physical therapy to work with her immobilization. She has been ambulating within her room with a walker and is not very active. She can could have a kyphoplasty of her pain not relieved over the next several days or weeks but with her fro condition and minimal physical activity I think it's okay to wait and advised her of this. I will check with her here in of her pain became worse and was uncontrollable we could get another follow-up x-ray of lumbar spine patient is not adjacent level that's causing the problem and treat her if she needed that for comfort. We'll continue to follow her while she is here. Bracing at L5 really is not effective and would not recommend that

## 2017-06-19 NOTE — Progress Notes (Signed)
Received call from lab regarding troponin being 0.11. MD made aware. No new orders at this time.

## 2017-06-19 NOTE — Progress Notes (Signed)
Palliative Medicine Team  Due to high volume of referrals, there is a delay seeing this patient. PMT not at [facility] over the weekend but will arrange goals of care with patient and family on Monday. Thank you for the opportunity to participate in the care of Ms. Sheri Webster.   Vennie Homans, FNP-C Palliative Medicine Team  Phone: [phone] Fax: [phone]

## 2017-06-19 NOTE — Progress Notes (Signed)
Family Meeting Note  Advance Directive:yes  Today a meeting took place with the Patient and son.  The following clinical team members were present during this meeting:MD  The following were discussed:Patient's diagnosis: , Patient's progosis: < 12 months and Goals for treatment: DNR   Diagnosis: Acute T4 compression fracture, possible L5 fracture:  * Syncope likelydue to dehydration * Hyponatremia due to dehydration: * slightly elevated troponins, new onset atrial fibrillation:  * essential hypertension:  * Hypothyroidism:  * history of urinary retention:   Additional follow-up to be provided: Palliative care evaluation  Time spent during discussion:20 minutes  Delfino Lovett, MD

## 2017-06-19 NOTE — Progress Notes (Signed)
PT Cancellation Note  Patient Details Name: Sheri Webster MRN: [id number] DOB: January 26, 1921   Cancelled Treatment:    Reason Eval/Treat Not Completed: Other (comment) Consult received and chart reviewed. Pt currently with multiple pending imaging consults, secondary to fall. Will re-attempt, when medically cleared. Will hold therapy consult at this time.    [name] 06/19/2017, 11:49 AM

## 2017-06-19 NOTE — Plan of Care (Signed)
Problem: Education: Goal: Knowledge of [facility] Education information/materials will improve Outcome: Progressing Patient admitted from [facility] for fall. Plan of care includes continuous cardiopulmonary monitoring, IV fluids, pain management, fall precautions and all other health conditions will be monitored while in the hospital,.  Problem: Safety: Goal: Ability to remain free from injury will improve Outcome: Progressing  Patient will remain free from falls and injury during the shift. Bed alarm on, bed on lowest postion, call bell in reach.   Problem: Pain Managment: Goal: General experience of comfort will improve Outcome: Progressing Patient pain will be managed with PRN pain medication per MD orders  Problem: Skin Integrity: Goal: Risk for impaired skin integrity will decrease Patient skin will remain clean dry and intact. No new breakdown during the shift.  Problem: Fluid Volume: Goal: Ability to maintain a balanced intake and output will improve Outcome: Progressing IVF infusing NS@ 58ml/hr for gentle hydration.

## 2017-06-19 NOTE — Care Management (Signed)
Patient presents from [facility] and she is also receiving home health nurse and physical therapy through [place] [facility].  Agency is aware of admission.  Discontinued occupational therapy consult .

## 2017-06-19 NOTE — Progress Notes (Signed)
1        [facility] [place] [facility]   PATIENT NAME: Geneive Sandstrom    MR#:  [id number]  DATE OF BIRTH:  [birth year]  SUBJECTIVE:  CHIEF COMPLAINT:   Chief Complaint  Patient presents with  . Fall  In a lot of pain, has difficulty hearing.  Son at bedside REVIEW OF SYSTEMS:  Review of Systems  Constitutional: Positive for malaise/fatigue. Negative for chills, fever and weight loss.  HENT: Negative for nosebleeds and sore throat.   Eyes: Negative for blurred vision.  Respiratory: Negative for cough, shortness of breath and wheezing.   Cardiovascular: Negative for chest pain, orthopnea, leg swelling and PND.  Gastrointestinal: Negative for abdominal pain, constipation, diarrhea, heartburn, nausea and vomiting.  Genitourinary: Negative for dysuria and urgency.  Musculoskeletal: Positive for back pain and falls.  Skin: Negative for rash.  Neurological: Positive for weakness. Negative for dizziness, speech change, focal weakness and headaches.  Endo/Heme/Allergies: Does not bruise/bleed easily.  Psychiatric/Behavioral: Negative for depression.    DRUG ALLERGIES:   Allergies  Allergen Reactions  . Keflex [Cephalexin]    VITALS:  Blood pressure (!) 159/87, pulse (!) 57, temperature 98.3 F (36.8 C), temperature source Oral, resp. rate 16, height 5\' 4"  (1.626 m), weight 57.1 kg (125 lb 14.4 oz), SpO2 98 %. PHYSICAL EXAMINATION:  Physical Exam  Constitutional: She is oriented to person, place, and time and well-developed, well-nourished, and in no distress.  HENT:  Head: Normocephalic and atraumatic.  Eyes: Pupils are equal, round, and reactive to light. Conjunctivae and EOM are normal.  Neck: Normal range of motion. Neck supple. No tracheal deviation present. No thyromegaly present.  Cardiovascular: Normal rate, regular rhythm and normal heart sounds.   Pulmonary/Chest: Effort normal and breath sounds normal. No respiratory distress. She has no wheezes.  She exhibits no tenderness.  Abdominal: Soft. Bowel sounds are normal. She exhibits no distension. There is no tenderness.  Musculoskeletal: Normal range of motion.       Thoracic back: She exhibits tenderness, bony tenderness and spasm.       Arms: Neurological: She is alert and oriented to person, place, and time. No cranial nerve deficit.  Skin: Skin is warm and dry. No rash noted.  Psychiatric: Mood and affect normal.   LABORATORY PANEL:  Female CBC  Recent Labs Lab 06/19/17 0221  WBC 10.5  HGB 12.2  HCT 34.8*  PLT 219   ------------------------------------------------------------------------------------------------------------------ Chemistries   Recent Labs Lab 06/18/17 1427 06/19/17 0221 06/19/17 0813  NA 129* 132*  --   K 4.1 2.9*  --   CL 91* 96*  --   CO2 27 28  --   GLUCOSE 119* 117*  --   BUN 14 14  --   CREATININE 0.69 0.72  --   CALCIUM 9.5 8.4*  --   MG  --   --  2.0  AST 34  --   --   ALT 13*  --   --   ALKPHOS 63  --   --   BILITOT 2.0*  --   --    RADIOLOGY:  Dg Lumbar Spine 2-3 Views  Result Date: 06/18/2017 CLINICAL DATA:  Fall. EXAM: LUMBAR SPINE - 2-3 VIEW COMPARISON:  No prior. FINDINGS: Surgical clips right upper quadrant . Coarse calcifications in the left upper quadrant possibly in the adrenal. Diffuse osteopenia. Lumbar spine scoliosis concave right. Diffuse degenerative change. No acute bony abnormality identified.Tiny sclerotic focus in the left sacrum  possibly a bone island. Aortoiliac atherosclerotic vascular calcification IMPRESSION: 1. Mild scoliosis concave right. Diffuse osteopenia degenerative change. No acute bony abnormality identified. 2. Aortoiliac atherosclerotic vascular disease. Electronically Signed   By: Maisie Fus  Register   On: 06/18/2017 16:55   Ct Thoracic Spine Wo Contrast  Result Date: 06/19/2017 CLINICAL DATA:  Thoracic spine pain since a fall out of bed 3 days ago. Initial encounter. EXAM: CT THORACIC SPINE WITHOUT  CONTRAST TECHNIQUE: Multidetector CT images of the thoracic were obtained using the standard protocol without intravenous contrast. COMPARISON:  None. FINDINGS: Alignment: Maintained.  Exaggeration of kyphosis noted. Vertebrae: The patient has a compression fracture of T4 with vertebral body height loss of approximately 50%. The fracture is age indeterminate. No bony retropulsion or involvement of the posterior elements is identified. No other fracture is seen. Bones appear osteopenic. Calcific aortic and coronary atherosclerosis is noted. Trace amount of pleural fluid is present on the right. Paraspinal and other soft tissues: Two remote lower left rib fractures are identified. Calcific aortic and coronary atherosclerosis is seen. Cardiomegaly. There is a tiny right pleural effusion. The patient is status post aortic valve repair. Disc levels: No bulge or protrusion is seen. The central canal and foramina appear open. IMPRESSION: T4 compression fracture with vertebral body height loss of approximately 50% is age indeterminate. There is no bony retropulsion. No other fracture is identified. Osteopenia. The central canal and foramina appear open at all levels. Calcific aortic and coronary atherosclerosis. Cardiomegaly. Small right pleural effusion. Electronically Signed   By: Drusilla Kanner M.D.   On: 06/19/2017 13:12   Ct Lumbar Spine Wo Contrast  Result Date: 06/19/2017 CLINICAL DATA:  Back pain since a fall 3 days ago. EXAM: CT LUMBAR SPINE WITHOUT CONTRAST TECHNIQUE: Multidetector CT imaging of the lumbar spine was performed without intravenous contrast administration. Multiplanar CT image reconstructions were also generated. COMPARISON:  Radiographs dated 06/18/2017 FINDINGS: Segmentation: 5 lumbar type vertebrae. Alignment: Normal. Vertebrae: Slight irregularity of the superior endplate of L5 which could represent an acute Schmorl's node. No disruption of the anterior or posterior aspects of the superior  endplate of L5. Paraspinal and other soft tissues: Aortic atherosclerosis. Disc levels: T11-12 and T12-L1:  Normal. L1-2:  Normal. L2-3:  Tiny broad-based disc bulge with no neural impingement. L3-4: Small broad-based soft disc protrusion. Congenitally short pedicles with hypertrophy of the ligamentum flavum facet joints all combine to create severe spinal stenosis. The thecal sac is almost obliterated. L4-5: Minimal broad-based disc bulge. However, congenitally short pedicles combined with hypertrophy of the ligamentum flavum and facet joints combine to create severe spinal stenosis. Possible acute small Schmorl's node in the superior endplate of L5 (central focal endplate fracture). L5-S1: Small broad-based disc bulge. Severe left facet arthritis. No focal neural impingement. IMPRESSION: 1. Possible focal central superior endplate fracture of L5 (acute Schmorl's node). 2. Severe spinal stenosis at L3-4. 3. Severe spinal stenosis at L4-5. 4. Aortic atherosclerosis. Electronically Signed   By: Francene Boyers M.D.   On: 06/19/2017 13:12   US Carotid Bilateral  Result Date: 06/19/2017 CLINICAL DATA:  Syncopal episode. History of hypertension and hyperlipidemia. History of heart valve replacement. Former smoker. EXAM: BILATERAL CAROTID DUPLEX ULTRASOUND TECHNIQUE: Wallace Cullens scale imaging, color Doppler and duplex ultrasound were performed of bilateral carotid and vertebral arteries in the neck. COMPARISON:  Chest radiograph - 06/18/2017 FINDINGS: Criteria: Quantification of carotid stenosis is based on velocity parameters that correlate the residual internal carotid diameter with NASCET-based stenosis levels, using the diameter of  the distal internal carotid lumen as the denominator for stenosis measurement. The following velocity measurements were obtained: RIGHT ICA:  62/16 cm/sec CCA:  51/5 cm/sec SYSTOLIC ICA/CCA RATIO:  1.2 DIASTOLIC ICA/CCA RATIO:  2.9 ECA:  60 cm/sec LEFT ICA:  77/20 cm/sec CCA:  52/9 cm/sec  SYSTOLIC ICA/CCA RATIO:  1.5 DIASTOLIC ICA/CCA RATIO:  2.1 ECA:  41 cm/sec RIGHT CAROTID ARTERY: There is a minimal amount of mixed echogenic plaque within the right carotid bulb (image 16), extending to involve the origin and proximal aspects of the right internal carotid artery (image 23), not resulting in elevated peak systolic velocities within the interrogated course of the right internal carotid artery to suggest a hemodynamically significant stenosis. RIGHT VERTEBRAL ARTERY:  Antegrade flow LEFT CAROTID ARTERY: There is a minimal amount of mixed echogenic plaque involving the origin and proximal aspects of the left internal carotid artery (image 55), not resulting in elevated peak systolic velocities within the interrogated course of the left internal carotid artery to suggest a hemodynamically significant stenosis. LEFT VERTEBRAL ARTERY:  Antegrade flow Note is made of an apparent cardiac arrhythmia (representative image 28). IMPRESSION: 1. Minimal amount of bilateral atherosclerotic plaque, right greater than left, not resulting in a hemodynamically significant stenosis within either internal carotid artery. 2. Apparent cardiac arrhythmia. Further evaluation with ECG monitoring could be performed as indicated. Electronically Signed   By: Simonne Come M.D.   On: 06/19/2017 13:08   ASSESSMENT AND PLAN:  83 y.o. female with a known history of Hypertension hypertension, who lives at [facility] had a fall when she was going to bathroom today morning. Did not lose consciousness. Patient hit her head and  Landed on back.  She was brought into the hospital for further evaluation and management.  *Acute T4 compression fracture, possible L5 fracture: Seen on a CT of the thoracolumbar spine -We will get orthopedic consultation as there is no neurosurgical consultation available -Discussed the case with Dr. Rosita Kea - pain medication to keep her comfortable  * Syncope likely due to dehydration  *  Hyponatremia due to dehydration: Continue gentle  hydration, sodium 129->132  * slightly elevated troponins, new onset atrial fibrillation: Likely supply demand ischemia secondary to dehydration.  - normal echocardiogram, monitor on telemetry.  - cardio c/s  * essential hypertension: Controlled, continue home dose medicines with Norvasc, Coreg .  * Hypothyroidism: Continue levothyroxine.   * history of urinary retention: monitor, intermittent suction. Avoid foley    Physical therapy and occupational therapy evaluation   All the records are reviewed and case discussed with Care Management/Social Worker. Management plans discussed with the patient, family (son at bedside) and they are in agreement.  CODE STATUS: DNR  TOTAL TIME TAKING CARE OF THIS PATIENT: 35 minutes.   More than 50% of the time was spent in counseling/coordination of care: YES  POSSIBLE D/C IN 2-3 DAYS, DEPENDING ON CLINICAL CONDITION.   Delfino Lovett M.D on 06/19/2017 at 3:55 PM  Between 7am to 6pm - Pager - [phone]  After 6pm go to [email or web address] - Social research officer, government  [facility]  Office  [phone]  CC: Primary care physician; Patient, No Pcp Per  Note: This dictation was prepared with Dragon dictation along with smaller phrase technology. Any transcriptional errors that result from this process are unintentional.

## 2017-06-19 NOTE — Progress Notes (Signed)
*  PRELIMINARY RESULTS* Echocardiogram 2D Echocardiogram has been performed.  Sheri Webster 06/19/2017, 9:00 AM

## 2017-06-20 DIAGNOSIS — M545 Low back pain: Secondary | ICD-10-CM

## 2017-06-20 DIAGNOSIS — Z953 Presence of xenogenic heart valve: Secondary | ICD-10-CM

## 2017-06-20 DIAGNOSIS — I10 Essential (primary) hypertension: Secondary | ICD-10-CM

## 2017-06-20 DIAGNOSIS — R748 Abnormal levels of other serum enzymes: Secondary | ICD-10-CM

## 2017-06-20 DIAGNOSIS — W19XXXA Unspecified fall, initial encounter: Secondary | ICD-10-CM

## 2017-06-20 DIAGNOSIS — S0990XA Unspecified injury of head, initial encounter: Secondary | ICD-10-CM

## 2017-06-20 DIAGNOSIS — I4891 Unspecified atrial fibrillation: Secondary | ICD-10-CM

## 2017-06-20 DIAGNOSIS — M4856XA Collapsed vertebra, not elsewhere classified, lumbar region, initial encounter for fracture: Principal | ICD-10-CM

## 2017-06-20 LAB — CBC
HCT: 36.6 % (ref 35.0–47.0)
HEMOGLOBIN: 12.6 g/dL (ref 12.0–16.0)
MCH: 34.1 pg — AB (ref 26.0–34.0)
MCHC: 34.4 g/dL (ref 32.0–36.0)
MCV: 99.2 fL (ref 80.0–100.0)
PLATELETS: 233 10*3/uL (ref 150–440)
RBC: 3.69 MIL/uL — ABNORMAL LOW (ref 3.80–5.20)
RDW: 14.8 % — ABNORMAL HIGH (ref 11.5–14.5)
WBC: 12.2 10*3/uL — AB (ref 3.6–11.0)

## 2017-06-20 LAB — BASIC METABOLIC PANEL
ANION GAP: 8 (ref 5–15)
BUN: 12 mg/dL (ref 6–20)
CO2: 24 mmol/L (ref 22–32)
Calcium: 8.5 mg/dL — ABNORMAL LOW (ref 8.9–10.3)
Chloride: 102 mmol/L (ref 101–111)
Creatinine, Ser: 0.6 mg/dL (ref 0.44–1.00)
GFR calc Af Amer: >60 mL/min (ref >60)
Glucose, Bld: 115 mg/dL — ABNORMAL HIGH (ref 65–99)
POTASSIUM: 4.2 mmol/L (ref 3.5–5.1)
SODIUM: 134 mmol/L — AB (ref 135–145)

## 2017-06-20 LAB — GLUCOSE, CAPILLARY: GLUCOSE-CAPILLARY: 111 mg/dL — AB (ref 65–99)

## 2017-06-20 MED ORDER — AMLODIPINE BESYLATE 5 MG PO TABS
2.5000 mg | ORAL_TABLET | Freq: Once | ORAL | Status: AC
  Administered 2017-06-20: 2.5 mg via ORAL
  Filled 2017-06-20: qty 1

## 2017-06-20 MED ORDER — METAXALONE 800 MG PO TABS
800.0000 mg | ORAL_TABLET | Freq: Three times a day (TID) | ORAL | Status: DC
  Administered 2017-06-20 – 2017-06-24 (×12): 800 mg via ORAL
  Filled 2017-06-20 (×14): qty 1

## 2017-06-20 MED ORDER — AMLODIPINE BESYLATE 5 MG PO TABS
5.0000 mg | ORAL_TABLET | Freq: Every day | ORAL | Status: DC
  Administered 2017-06-21 – 2017-06-24 (×4): 5 mg via ORAL
  Filled 2017-06-20 (×4): qty 1

## 2017-06-20 NOTE — Progress Notes (Signed)
Patient was seen today and continues to be confused. She reports hitting her button multiple times and no 100 seen her today, which is certainly not true. On exam she is tender diffusely over her spine. Impression is elderly female with probable L5 compression fracture with diffuse pain and is prior not a candidate for kyphoplasty unless there is more localizing 5 signs that showed it was just a single level. We'll continue to follow her and if should her pain better localizes we'll get a follow-up x-ray and see if there is collapse or changes to her spine but at present plan nonoperative treatment

## 2017-06-20 NOTE — Consult Note (Signed)
Cardiology Consultation:   Patient ID: Sheri Webster; [id number]; [birth year]   Admit date: 06/18/2017 Date of Consult: 06/20/2017  Primary Care Provider: Patient, No Pcp [place] Primary Cardiologist: New to [facility] Primary Electrophysiologist:  None Physician requesting consult, Dr. Karlene Lineman Reason for consult: Elevated troponin, hypertension, atrial fibrillation   Patient Profile:   Sheri Webster is a 83 y.o. 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 No help arrived and she started walking to the bathroom on her own with a walker. Legs gave out on the way to the bathroom, fell down, hit her back, right for head. She notes catheter had recently been removed for history of urine retention  She presented to the emergency room in a cervical collar Markedly elevated blood pressure systolic 188 on arrival to the ER Initial sodium 129 CT scan with no acute findings On initial EKG noted to be in atrial fibrillation. No prior history of atrial fibrillation per the patient  Over the course of her hospital stay sodium 129 then up to 132, now 134  Review of telemetry shows atrial fibrillation with rate in the 60s to 63s Son by the bedside    Past Medical History:  Diagnosis Date  . Arthritis   . Hyperlipidemia   . Hypertension     Past Surgical History:  Procedure Laterality Date  . ABDOMINAL HYSTERECTOMY    . CARDIAC SURGERY  2004   heart valve replacement     Home Medications:  Prior to Admission medications   Medication Sig Start Date End Date Taking? Authorizing Provider  acetaminophen (TYLENOL) 500 MG tablet Take 500 mg by  mouth every 6 (six) hours as needed.   Yes [provider]  amLODipine (NORVASC) 2.5 MG tablet Take 2.5 mg by mouth daily.   Yes [provider]  aspirin EC 81 MG tablet Take 81 mg by mouth 2 (two) times daily.    Yes [provider]  bisacodyl (DULCOLAX) 10 MG suppository Place 10 mg rectally as needed for moderate constipation.   Yes [provider]  calcium-vitamin D (OSCAL WITH D) 500-200 MG-UNIT tablet Take 1 tablet by mouth 2 (two) times daily.    Yes [provider]  carvedilol (COREG) 6.25 MG tablet Take 6.25 mg by mouth 2 (two) times daily with a meal.   Yes [provider]  cyclobenzaprine (FLEXERIL) 5 MG tablet Take 5 mg by mouth 3 (three) times daily as needed for muscle spasms.   Yes [provider]  ergocalciferol (VITAMIN D2) 50000 units capsule Take 50,000 Units by mouth once a week.   Yes [provider]  Infant Care Products (DERMACLOUD) CREA Apply topically.   Yes [provider]  levothyroxine (SYNTHROID, LEVOTHROID) 88 MCG tablet Take 88 mcg by mouth daily before breakfast.   Yes [provider]  magnesium hydroxide (MILK OF MAGNESIA) 400 MG/5ML suspension Take by mouth daily as needed for mild constipation.   Yes [provider]  Melatonin 3 MG TABS Take 6 mg by mouth at bedtime.    Yes [provider]  nystatin (NYSTATIN) powder Apply topically 2 (two) times daily.    Yes  [provider]  pravastatin (PRAVACHOL) 40 MG tablet Take 40 mg by mouth daily.   Yes [provider]  senna (SENOKOT) 8.6 MG tablet Take 2 tablets by mouth 2 (two) times daily.    Yes [provider]  traZODone (DESYREL) 50 MG tablet Take 25 mg by mouth at bedtime.    Yes [provider]    Inpatient Medications: Scheduled Meds: . [START ON 06/21/2017] amLODipine  5 mg Oral Daily  . aspirin EC  81 mg Oral BID  . docusate sodium  100 mg Oral BID  . enoxaparin  (LOVENOX) injection  40 mg Subcutaneous Q24H  . fentaNYL  25 mcg Transdermal Q72H  . levothyroxine  88 mcg Oral QAC breakfast  . metaxalone  800 mg Oral TID  . pravastatin  40 mg Oral QPC supper  . traZODone  25 mg Oral QHS   Continuous Infusions:  PRN Meds: acetaminophen **OR** acetaminophen, bisacodyl, morphine injection, ondansetron **OR** ondansetron (ZOFRAN) IV, traMADol  Allergies:    Allergies  Allergen Reactions  . Keflex [Cephalexin]     Social History:   Social History   Social History  . Marital status: Widowed    Spouse name: N/A  . Number of children: N/A  . Years of education: N/A   Occupational History  . Not on file.   Social History Main Topics  . Smoking status: Former Smoker    Quit date: 11/04/1995  . Smokeless tobacco: Never Used  . Alcohol use No  . Drug use: No  . Sexual activity: Not on file   Other Topics Concern  . Not on file   Social History Narrative  . No narrative on file    Family History:    Family History  Problem Relation Age of Onset  . Bladder Cancer Neg Hx   . Kidney cancer Neg Hx      ROS:  Please see the history of present illness.  Review of Systems  Constitution: Positive for weakness. Negative for diaphoresis, fever, malaise/fatigue and night sweats.  HENT: Negative.   Eyes: Negative.   Cardiovascular: Negative for chest pain, claudication, cyanosis, dyspnea on exertion, irregular heartbeat, leg swelling, near-syncope, orthopnea, palpitations and paroxysmal nocturnal dyspnea.  Respiratory: Negative for cough, shortness of breath, sleep disturbances due to breathing and wheezing.   Endocrine: Negative.   Hematologic/Lymphatic: Negative.   Skin: Negative.   Musculoskeletal: Positive for back pain. Negative for falls, joint pain, joint swelling and myalgias.  Gastrointestinal: Negative.   Neurological: Negative for difficulty with concentration, excessive daytime sleepiness, dizziness, focal weakness,  light-headedness and numbness.  Psychiatric/Behavioral: Negative.   All other ROS reviewed and negative.     Physical Exam/Data:   Vitals:   06/20/17 0511 06/20/17 0854 06/20/17 1045 06/20/17 1338  BP: (!) 178/80 (!) 183/86 (!) 168/78 (!) 163/83  Pulse: 80 69  86  Resp: 18 20  (!) 22  Temp: 97.6 F (36.4 C) 98 F (36.7 C)  98 F (36.7 C)  TempSrc: Oral Oral  Oral  SpO2: 97% 95%  98%  Weight: 124 lb 8 oz (56.5 kg)     Height:        Intake/Output Summary (Last 24 hours) at 06/20/17 1356 Last data filed at 06/20/17 1201  Gross per 24 hour  Intake             1100 ml  Output             3050 ml  Net            -  1950 ml   Filed Weights   06/18/17 1323 06/19/17 0437 06/20/17 0511  Weight: 135 lb (61.2 kg) 125 lb 14.4 oz (57.1 kg) 124 lb 8 oz (56.5 kg)   Body mass index is 21.37 kg/m.  General:  Thin ,  no acute distress, supine in bed HEENT: normal Lymph: no adenopathy Neck: no JVD Endocrine:  No thryomegaly Vascular: No carotid bruits; FA pulses 2+ bilaterally without bruits  Cardiac:  Irregularly irregular,  no murmur  Lungs:  clear to auscultation bilaterally, no wheezing, rhonchi or rales  Abd: soft, nontender, no hepatomegaly  Ext: no edema Musculoskeletal:  No deformities, BUE and BLE strength normal and equal Skin: warm and dry  Neuro:  CNs 2-12 intact, no focal abnormalities noted Psych:  Normal affect   EKG:  The EKG was personally reviewed and demonstrates:  Atrial fibrillation, no significant ST or T-wave changes, unable to exclude old anterior MI Telemetry:  Telemetry was personally reviewed and demonstrates:  Atrial fibrillation rate in the 70s  Relevant CV Studies: Echocardiogram, normal LV function EF greater than 60%, no significant valvular disease  Laboratory Data:  Chemistry Recent Labs Lab 06/18/17 1427 06/19/17 0221 06/20/17 0422  NA 129* 132* 134*  K 4.1 2.9* 4.2  CL 91* 96* 102  CO2 27 28 24   GLUCOSE 119* 117* 115*  BUN 14 14 12     CREATININE 0.69 0.72 0.60  CALCIUM 9.5 8.4* 8.5*  GFRNONAA >60 >60 >60  GFRAA >60 >60 >60  ANIONGAP 11 8 8      Recent Labs Lab 06/18/17 1427  PROT 7.8  ALBUMIN 4.5  AST 34  ALT 13*  ALKPHOS 63  BILITOT 2.0*   Hematology Recent Labs Lab 06/18/17 1427 06/19/17 0221 06/20/17 0422  WBC 15.8* 10.5 12.2*  RBC 4.28 3.54* 3.69*  HGB 14.4 12.2 12.6  HCT 40.9 34.8* 36.6  MCV 95.6 98.2 99.2  MCH 33.6 34.5* 34.1*  MCHC 35.1 35.1 34.4  RDW 14.9* 14.2 14.8*  PLT 305 219 233   Cardiac Enzymes Recent Labs Lab 06/18/17 2005 06/19/17 0221 06/19/17 0813 06/19/17 1051 06/19/17 1438 06/19/17 1831  TROPONINI 0.04* <0.03 0.05* <0.03 0.11* 0.03*   No results for input(s): TROPIPOC in the last 168 hours.  BNPNo results for input(s): BNP, PROBNP in the last 168 hours.  DDimer No results for input(s): DDIMER in the last 168 hours.  Radiology/Studies:  Dg Chest 1 View  Result Date: 06/18/2017 CLINICAL DATA:  Status post fall.  Back pain. EXAM: CHEST 1 VIEW COMPARISON:  None. FINDINGS: The heart size and mediastinal contours are within normal limits. Prior TAVR. Both lungs are clear. The visualized skeletal structures are unremarkable. IMPRESSION: No active disease. Electronically Signed   By: Elige Ko   On: 06/18/2017 14:56   Dg Lumbar Spine 2-3 Views  Result Date: 06/18/2017 CLINICAL DATA:  Fall. EXAM: LUMBAR SPINE - 2-3 VIEW COMPARISON:  No prior. FINDINGS: Surgical clips right upper quadrant . Coarse calcifications in the left upper quadrant possibly in the adrenal. Diffuse osteopenia. Lumbar spine scoliosis concave right. Diffuse degenerative change. No acute bony abnormality identified.Tiny sclerotic focus in the left sacrum possibly a bone island. Aortoiliac atherosclerotic vascular calcification IMPRESSION: 1. Mild scoliosis concave right. Diffuse osteopenia degenerative change. No acute bony abnormality identified. 2. Aortoiliac atherosclerotic vascular disease. Electronically  Signed   By: Maisie Fus  Register   On: 06/18/2017 16:55   Ct Head Wo Contrast  Result Date: 06/18/2017 CLINICAL DATA:  Getting out of bit head. Fall.  Possible loss of consciousness. History of dementia. Soft tissue hematoma. EXAM: CT HEAD WITHOUT CONTRAST CT CERVICAL SPINE WITHOUT CONTRAST TECHNIQUE: Multidetector CT imaging of the head and cervical spine was performed following the standard protocol without intravenous contrast. Multiplanar CT image reconstructions of the cervical spine were also generated. COMPARISON:  None. FINDINGS: CT HEAD FINDINGS Brain: Generalized atrophy. Chronic small-vessel ischemic changes of the cerebral hemispheric white matter. No evidence of acute or subacute infarction, mass lesion, hemorrhage, hydrocephalus or extra-axial collection. Vascular: There is atherosclerotic calcification of the major vessels at the base of the brain. Skull: No skull fracture. Sinuses/Orbits: Clear/normal Other: Right frontal scalp hematoma. CT CERVICAL SPINE FINDINGS Alignment: Normal Skull base and vertebrae: No acute or traumatic finding. Soft tissues and spinal canal: No significant soft tissue finding. Ordinary carotid artery atherosclerosis. Disc levels: Mild spondylosis C3-4 and C5-6. Mild facet osteoarthritis right C2-3 and C3-4 and left C3-4 and C4-5. Upper chest: Benign appearing pleural and parenchymal apical scarring. Other: None IMPRESSION: Head CT: No acute or traumatic intracranial finding. Atrophy and chronic small vessel disease. No skull fracture. Right frontal scalp hematoma. Cervical spine CT: No acute or traumatic finding. Mild degenerative changes for age. Electronically Signed   By: Paulina Fusi M.D.   On: 06/18/2017 14:05   Ct Cervical Spine Wo Contrast  Result Date: 06/18/2017 CLINICAL DATA:  Getting out of bit head. Fall. Possible loss of consciousness. History of dementia. Soft tissue hematoma. EXAM: CT HEAD WITHOUT CONTRAST CT CERVICAL SPINE WITHOUT CONTRAST TECHNIQUE:  Multidetector CT imaging of the head and cervical spine was performed following the standard protocol without intravenous contrast. Multiplanar CT image reconstructions of the cervical spine were also generated. COMPARISON:  None. FINDINGS: CT HEAD FINDINGS Brain: Generalized atrophy. Chronic small-vessel ischemic changes of the cerebral hemispheric white matter. No evidence of acute or subacute infarction, mass lesion, hemorrhage, hydrocephalus or extra-axial collection. Vascular: There is atherosclerotic calcification of the major vessels at the base of the brain. Skull: No skull fracture. Sinuses/Orbits: Clear/normal Other: Right frontal scalp hematoma. CT CERVICAL SPINE FINDINGS Alignment: Normal Skull base and vertebrae: No acute or traumatic finding. Soft tissues and spinal canal: No significant soft tissue finding. Ordinary carotid artery atherosclerosis. Disc levels: Mild spondylosis C3-4 and C5-6. Mild facet osteoarthritis right C2-3 and C3-4 and left C3-4 and C4-5. Upper chest: Benign appearing pleural and parenchymal apical scarring. Other: None IMPRESSION: Head CT: No acute or traumatic intracranial finding. Atrophy and chronic small vessel disease. No skull fracture. Right frontal scalp hematoma. Cervical spine CT: No acute or traumatic finding. Mild degenerative changes for age. Electronically Signed   By: Paulina Fusi M.D.   On: 06/18/2017 14:05   Ct Thoracic Spine Wo Contrast  Result Date: 06/19/2017 CLINICAL DATA:  Thoracic spine pain since a fall out of bed 3 days ago. Initial encounter. EXAM: CT THORACIC SPINE WITHOUT CONTRAST TECHNIQUE: Multidetector CT images of the thoracic were obtained using the standard protocol without intravenous contrast. COMPARISON:  None. FINDINGS: Alignment: Maintained.  Exaggeration of kyphosis noted. Vertebrae: The patient has a compression fracture of T4 with vertebral body height loss of approximately 50%. The fracture is age indeterminate. No bony retropulsion  or involvement of the posterior elements is identified. No other fracture is seen. Bones appear osteopenic. Calcific aortic and coronary atherosclerosis is noted. Trace amount of pleural fluid is present on the right. Paraspinal and other soft tissues: Two remote lower left rib fractures are identified. Calcific aortic and coronary atherosclerosis is seen. Cardiomegaly. There is  a tiny right pleural effusion. The patient is status post aortic valve repair. Disc levels: No bulge or protrusion is seen. The central canal and foramina appear open. IMPRESSION: T4 compression fracture with vertebral body height loss of approximately 50% is age indeterminate. There is no bony retropulsion. No other fracture is identified. Osteopenia. The central canal and foramina appear open at all levels. Calcific aortic and coronary atherosclerosis. Cardiomegaly. Small right pleural effusion. Electronically Signed   By: Drusilla Kanner M.D.   On: 06/19/2017 13:12   Ct Lumbar Spine Wo Contrast  Result Date: 06/19/2017 CLINICAL DATA:  Back pain since a fall 3 days ago. EXAM: CT LUMBAR SPINE WITHOUT CONTRAST TECHNIQUE: Multidetector CT imaging of the lumbar spine was performed without intravenous contrast administration. Multiplanar CT image reconstructions were also generated. COMPARISON:  Radiographs dated 06/18/2017 FINDINGS: Segmentation: 5 lumbar type vertebrae. Alignment: Normal. Vertebrae: Slight irregularity of the superior endplate of L5 which could represent an acute Schmorl's node. No disruption of the anterior or posterior aspects of the superior endplate of L5. Paraspinal and other soft tissues: Aortic atherosclerosis. Disc levels: T11-12 and T12-L1:  Normal. L1-2:  Normal. L2-3:  Tiny broad-based disc bulge with no neural impingement. L3-4: Small broad-based soft disc protrusion. Congenitally short pedicles with hypertrophy of the ligamentum flavum facet joints all combine to create severe spinal stenosis. The thecal sac  is almost obliterated. L4-5: Minimal broad-based disc bulge. However, congenitally short pedicles combined with hypertrophy of the ligamentum flavum and facet joints combine to create severe spinal stenosis. Possible acute small Schmorl's node in the superior endplate of L5 (central focal endplate fracture). L5-S1: Small broad-based disc bulge. Severe left facet arthritis. No focal neural impingement. IMPRESSION: 1. Possible focal central superior endplate fracture of L5 (acute Schmorl's node). 2. Severe spinal stenosis at L3-4. 3. Severe spinal stenosis at L4-5. 4. Aortic atherosclerosis. Electronically Signed   By: Francene Boyers M.D.   On: 06/19/2017 13:12   US Carotid Bilateral  Result Date: 06/19/2017 CLINICAL DATA:  Syncopal episode. History of hypertension and hyperlipidemia. History of heart valve replacement. Former smoker. EXAM: BILATERAL CAROTID DUPLEX ULTRASOUND TECHNIQUE: Wallace Cullens scale imaging, color Doppler and duplex ultrasound were performed of bilateral carotid and vertebral arteries in the neck. COMPARISON:  Chest radiograph - 06/18/2017 FINDINGS: Criteria: Quantification of carotid stenosis is based on velocity parameters that correlate the residual internal carotid diameter with NASCET-based stenosis levels, using the diameter of the distal internal carotid lumen as the denominator for stenosis measurement. The following velocity measurements were obtained: RIGHT ICA:  62/16 cm/sec CCA:  51/5 cm/sec SYSTOLIC ICA/CCA RATIO:  1.2 DIASTOLIC ICA/CCA RATIO:  2.9 ECA:  60 cm/sec LEFT ICA:  77/20 cm/sec CCA:  52/9 cm/sec SYSTOLIC ICA/CCA RATIO:  1.5 DIASTOLIC ICA/CCA RATIO:  2.1 ECA:  41 cm/sec RIGHT CAROTID ARTERY: There is a minimal amount of mixed echogenic plaque within the right carotid bulb (image 16), extending to involve the origin and proximal aspects of the right internal carotid artery (image 23), not resulting in elevated peak systolic velocities within the interrogated course of the right  internal carotid artery to suggest a hemodynamically significant stenosis. RIGHT VERTEBRAL ARTERY:  Antegrade flow LEFT CAROTID ARTERY: There is a minimal amount of mixed echogenic plaque involving the origin and proximal aspects of the left internal carotid artery (image 55), not resulting in elevated peak systolic velocities within the interrogated course of the left internal carotid artery to suggest a hemodynamically significant stenosis. LEFT VERTEBRAL ARTERY:  Antegrade flow Note  is made of an apparent cardiac arrhythmia (representative image 28). IMPRESSION: 1. Minimal amount of bilateral atherosclerotic plaque, right greater than left, not resulting in a hemodynamically significant stenosis within either internal carotid artery. 2. Apparent cardiac arrhythmia. Further evaluation with ECG monitoring could be performed as indicated. Electronically Signed   By: Simonne Come M.D.   On: 06/19/2017 13:08    Assessment and Plan:   1) Atrial fibrillation appears new though timing of onset unclear she is relatively asymptomatic, rate relatively well-controlled Long discussion with patient and son at the bedside Currently not a good candidate for anticoagulation given recent fall, significant leg weakness. Son does not think it is a wise idea We did spend time discussing risk of stroke and bleeding -Son prefers at this time not to use blood thinner. This is an acceptable decision at this time given the risk and can be rediscussed in follow-up -Would continue carvedilol for rate control -No plan at this time to restore normal sinus rhythm and she is relatively asymptomatic  2) elevated troponin In the setting of fall, hypertension Not a primary ischemic event, this is not a non-STEMI No further ischemic workup needed at this time  3) essential hypertension Continue Coreg, increase Norvasc for blood pressure control Would likely need Norvasc 10 mg  4) L5 superior endplate compression  fracture Seen by orthopedic surgery Medical management unless pain does not improve  5) fall Etiology unclear, notes indicating syncope though patient reports she did not lose consciousness, legs got weak and did not make it to the bathroom Suspect mechanical fall    Total encounter time more than 110 minutes  Greater than 50% was spent in counseling and coordination of care with the patient   Signed, Julien Nordmann, MD  06/20/2017 1:56 PM

## 2017-06-20 NOTE — Evaluation (Signed)
Physical Therapy Evaluation Patient Details Name: Sheri Webster MRN: [id number] DOB: 1921/05/10 Today's Date: 06/20/2017   History of Present Illness  Patient is a 83 y/o female that presents with severe low back pain. She was found to have acute L5 compression fx, severe stenosis at L3-L4, L4-L5. New a-fib likely in the presence of demand ischemia due to dehydration.   Clinical Impression  Patient admitted with severe low back pain after fall, found to have acute L5 compression fx. Per discussion with son she has progressively declined in her ability to ambulate, she has recently been using a RW for slow, short distance mobility. She is unable to provide assistance in transfers in this session and is unable to tolerate standing for any length of time before her legs begin to buckle secondary to low back pain. No overt cord compression signs observed. Patient requires significant level of physical assistance and is really unable to tolerate transfers at this time and would benefit from short term rehabilitation when she is discharged.     Follow Up Recommendations SNF    Equipment Recommendations       Recommendations for Other Services       Precautions / Restrictions Precautions Precautions: Back Precaution Booklet Issued: No Restrictions Weight Bearing Restrictions: No      Mobility  Bed Mobility Overal bed mobility: Needs Assistance Bed Mobility: Supine to Sit;Sit to Supine     Supine to sit: +2 for physical assistance;Max assist Sit to supine: +2 for physical assistance;Max assist   General bed mobility comments: Patient is unable to provide any effort through her LEs or torso in this session to roll or logroll. PT and CNA performed max A transfers with scooping method to assist patient to and from sitting.   Transfers Overall transfer level: Needs assistance Equipment used: Rolling walker (2 wheeled) Transfers: Sit to/from Stand Sit to Stand: Max assist;+2 physical  assistance         General transfer comment: PT and CNA attempt to perform 2 sit to stands, patient continues to lean posteriorly and has LE buckling throughout transfers secondary to lumbar pain. Unable to maintain standing position.   Ambulation/Gait                Stairs            Wheelchair Mobility    Modified Rankin (Stroke Patients Only)       Balance Overall balance assessment: Needs assistance;History of Falls Sitting-balance support: Bilateral upper extremity supported;Feet supported Sitting balance-Leahy Scale: Fair     Standing balance support: Bilateral upper extremity supported Standing balance-Leahy Scale: Zero                               Pertinent Vitals/Pain Pain Assessment: Faces Faces Pain Scale: Hurts whole lot Pain Location: Low back Pain Descriptors / Indicators: Aching Pain Intervention(s): Limited activity within patient's tolerance;Monitored during session;Repositioned    Home Living Family/patient expects to be discharged to:: Skilled nursing facility                      Prior Function Level of Independence: Needs assistance   Gait / Transfers Assistance Needed: Per son patient has been using RW for very flexed slow gait.            Hand Dominance        Extremity/Trunk Assessment   Upper Extremity Assessment Upper Extremity Assessment: Generalized weakness  Lower Extremity Assessment Lower Extremity Assessment: Generalized weakness (Patient is able to complete low ROM SLRs bilaterally)       Communication   Communication: No difficulties  Cognition Arousal/Alertness: Awake/alert Behavior During Therapy: WFL for tasks assessed/performed Overall Cognitive Status: History of cognitive impairments - at baseline                                        General Comments General comments (skin integrity, edema, etc.): Bruising on the R side of her forehead.     Exercises      Assessment/Plan    PT Assessment Patient needs continued PT services  PT Problem List Decreased strength;Decreased mobility;Pain;Decreased balance;Decreased knowledge of use of DME;Decreased activity tolerance;Decreased knowledge of precautions;Decreased safety awareness       PT Treatment Interventions DME instruction;Gait training;Stair training;Functional mobility training;Neuromuscular re-education;Manual techniques;Balance training;Therapeutic exercise;Therapeutic activities;Patient/family education    PT Goals (Current goals can be found in the Care Plan section)  Acute Rehab PT Goals Patient Stated Goal: To reduce her pain PT Goal Formulation: With family Time For Goal Achievement: 07/04/17 Potential to Achieve Goals: Good    Frequency 7X/week   Barriers to discharge        Co-evaluation               AM-PAC PT "6 Clicks" Daily Activity  Outcome Measure Difficulty turning over in bed (including adjusting bedclothes, sheets and blankets)?: A Lot Difficulty moving from lying on back to sitting on the side of the bed? : A Lot Difficulty sitting down on and standing up from a chair with arms (e.g., wheelchair, bedside commode, etc,.)?: Unable Help needed moving to and from a bed to chair (including a wheelchair)?: Total Help needed walking in hospital room?: Total Help needed climbing 3-5 steps with a railing? : Total 6 Click Score: 8    End of Session Equipment Utilized During Treatment: Gait belt Activity Tolerance: Patient limited by pain Patient left: in bed;with call bell/phone within reach;with nursing/sitter in room Nurse Communication: Mobility status PT Visit Diagnosis: Muscle weakness (generalized) (M62.81);History of falling (Z91.81)    Time: 0071-2197 PT Time Calculation (min) (ACUTE ONLY): 16 min   Charges:   PT Evaluation $PT Eval Moderate Complexity: 1 Mod     PT G Codes:   PT G-Codes **NOT FOR INPATIENT CLASS** Functional Assessment  Tool Used: AM-PAC 6 Clicks Basic Mobility Functional Limitation: Mobility: Walking and moving around Mobility: Walking and Moving Around Current Status (J8832): At least 80 percent but less than 100 percent impaired, limited or restricted Mobility: Walking and Moving Around Goal Status [phone]): At least 80 percent but less than 100 percent impaired, limited or restricted   Alva Garnet PT, DPT, CSCS    06/20/2017, 1:01 PM

## 2017-06-20 NOTE — Progress Notes (Signed)
1        [facility] [place] [facility]   PATIENT NAME: Sheri Webster    MR#:  [id number]  DATE OF BIRTH:  03/11/1921  SUBJECTIVE: . Low back pain worse with even with little movement.   CHIEF COMPLAINT:   Chief Complaint  Patient presents with  . Fall  In a lot of pain, has difficulty hearing.  Son at bedside REVIEW OF SYSTEMS:  Review of Systems  Constitutional: Positive for malaise/fatigue. Negative for chills, fever and weight loss.  HENT: Negative for nosebleeds and sore throat.   Eyes: Negative for blurred vision.  Respiratory: Negative for cough, shortness of breath and wheezing.   Cardiovascular: Negative for chest pain, orthopnea, leg swelling and PND.  Gastrointestinal: Negative for abdominal pain, constipation, diarrhea, heartburn, nausea and vomiting.  Genitourinary: Negative for dysuria and urgency.  Musculoskeletal: Positive for back pain and falls.  Skin: Negative for rash.  Neurological: Positive for weakness. Negative for dizziness, speech change, focal weakness and headaches.  Endo/Heme/Allergies: Does not bruise/bleed easily.  Psychiatric/Behavioral: Negative for depression.    DRUG ALLERGIES:   Allergies  Allergen Reactions  . Keflex [Cephalexin]    VITALS:  Blood pressure (!) 168/78, pulse 69, temperature 98 F (36.7 C), temperature source Oral, resp. rate 20, height 5\' 4"  (1.626 m), weight 56.5 kg (124 lb 8 oz), SpO2 95 %. PHYSICAL EXAMINATION:  Physical Exam  Constitutional: She is oriented to person, place, and time and well-developed, well-nourished, and in no distress.  HENT:  Head: Normocephalic and atraumatic.  Eyes: Pupils are equal, round, and reactive to light. Conjunctivae and EOM are normal.  Neck: Normal range of motion. Neck supple. No tracheal deviation present. No thyromegaly present.  Cardiovascular: Normal rate, regular rhythm and normal heart sounds.   Pulmonary/Chest: Effort normal and breath sounds normal.  No respiratory distress. She has no wheezes. She exhibits no tenderness.  Abdominal: Soft. Bowel sounds are normal. She exhibits no distension. There is no tenderness.  Musculoskeletal: Normal range of motion.       Thoracic back: She exhibits tenderness, bony tenderness and spasm.       Arms: Neurological: She is alert and oriented to person, place, and time. No cranial nerve deficit.  Skin: Skin is warm and dry. No rash noted.  Psychiatric: Mood and affect normal.   LABORATORY PANEL:  Female CBC  Recent Labs Lab 06/20/17 0422  WBC 12.2*  HGB 12.6  HCT 36.6  PLT 233   ------------------------------------------------------------------------------------------------------------------ Chemistries   Recent Labs Lab 06/18/17 1427  06/19/17 0813 06/20/17 0422  NA 129*  < >  --  134*  K 4.1  < >  --  4.2  CL 91*  < >  --  102  CO2 27  < >  --  24  GLUCOSE 119*  < >  --  115*  BUN 14  < >  --  12  CREATININE 0.69  < >  --  0.60  CALCIUM 9.5  < >  --  8.5*  MG  --   --  2.0  --   AST 34  --   --   --   ALT 13*  --   --   --   ALKPHOS 63  --   --   --   BILITOT 2.0*  --   --   --   < > = values in this interval not displayed. RADIOLOGY:  Ct Thoracic Spine Wo  Contrast  Result Date: 06/19/2017 CLINICAL DATA:  Thoracic spine pain since a fall out of bed 3 days ago. Initial encounter. EXAM: CT THORACIC SPINE WITHOUT CONTRAST TECHNIQUE: Multidetector CT images of the thoracic were obtained using the standard protocol without intravenous contrast. COMPARISON:  None. FINDINGS: Alignment: Maintained.  Exaggeration of kyphosis noted. Vertebrae: The patient has a compression fracture of T4 with vertebral body height loss of approximately 50%. The fracture is age indeterminate. No bony retropulsion or involvement of the posterior elements is identified. No other fracture is seen. Bones appear osteopenic. Calcific aortic and coronary atherosclerosis is noted. Trace amount of pleural fluid is  present on the right. Paraspinal and other soft tissues: Two remote lower left rib fractures are identified. Calcific aortic and coronary atherosclerosis is seen. Cardiomegaly. There is a tiny right pleural effusion. The patient is status post aortic valve repair. Disc levels: No bulge or protrusion is seen. The central canal and foramina appear open. IMPRESSION: T4 compression fracture with vertebral body height loss of approximately 50% is age indeterminate. There is no bony retropulsion. No other fracture is identified. Osteopenia. The central canal and foramina appear open at all levels. Calcific aortic and coronary atherosclerosis. Cardiomegaly. Small right pleural effusion. Electronically Signed   By: Drusilla Kanner M.D.   On: 06/19/2017 13:12   Ct Lumbar Spine Wo Contrast  Result Date: 06/19/2017 CLINICAL DATA:  Back pain since a fall 3 days ago. EXAM: CT LUMBAR SPINE WITHOUT CONTRAST TECHNIQUE: Multidetector CT imaging of the lumbar spine was performed without intravenous contrast administration. Multiplanar CT image reconstructions were also generated. COMPARISON:  Radiographs dated 06/18/2017 FINDINGS: Segmentation: 5 lumbar type vertebrae. Alignment: Normal. Vertebrae: Slight irregularity of the superior endplate of L5 which could represent an acute Schmorl's node. No disruption of the anterior or posterior aspects of the superior endplate of L5. Paraspinal and other soft tissues: Aortic atherosclerosis. Disc levels: T11-12 and T12-L1:  Normal. L1-2:  Normal. L2-3:  Tiny broad-based disc bulge with no neural impingement. L3-4: Small broad-based soft disc protrusion. Congenitally short pedicles with hypertrophy of the ligamentum flavum facet joints all combine to create severe spinal stenosis. The thecal sac is almost obliterated. L4-5: Minimal broad-based disc bulge. However, congenitally short pedicles combined with hypertrophy of the ligamentum flavum and facet joints combine to create severe  spinal stenosis. Possible acute small Schmorl's node in the superior endplate of L5 (central focal endplate fracture). L5-S1: Small broad-based disc bulge. Severe left facet arthritis. No focal neural impingement. IMPRESSION: 1. Possible focal central superior endplate fracture of L5 (acute Schmorl's node). 2. Severe spinal stenosis at L3-4. 3. Severe spinal stenosis at L4-5. 4. Aortic atherosclerosis. Electronically Signed   By: Francene Boyers M.D.   On: 06/19/2017 13:12   US Carotid Bilateral  Result Date: 06/19/2017 CLINICAL DATA:  Syncopal episode. History of hypertension and hyperlipidemia. History of heart valve replacement. Former smoker. EXAM: BILATERAL CAROTID DUPLEX ULTRASOUND TECHNIQUE: Wallace Cullens scale imaging, color Doppler and duplex ultrasound were performed of bilateral carotid and vertebral arteries in the neck. COMPARISON:  Chest radiograph - 06/18/2017 FINDINGS: Criteria: Quantification of carotid stenosis is based on velocity parameters that correlate the residual internal carotid diameter with NASCET-based stenosis levels, using the diameter of the distal internal carotid lumen as the denominator for stenosis measurement. The following velocity measurements were obtained: RIGHT ICA:  62/16 cm/sec CCA:  51/5 cm/sec SYSTOLIC ICA/CCA RATIO:  1.2 DIASTOLIC ICA/CCA RATIO:  2.9 ECA:  60 cm/sec LEFT ICA:  77/20 cm/sec CCA:  52/9 cm/sec SYSTOLIC ICA/CCA RATIO:  1.5 DIASTOLIC ICA/CCA RATIO:  2.1 ECA:  41 cm/sec RIGHT CAROTID ARTERY: There is a minimal amount of mixed echogenic plaque within the right carotid bulb (image 16), extending to involve the origin and proximal aspects of the right internal carotid artery (image 23), not resulting in elevated peak systolic velocities within the interrogated course of the right internal carotid artery to suggest a hemodynamically significant stenosis. RIGHT VERTEBRAL ARTERY:  Antegrade flow LEFT CAROTID ARTERY: There is a minimal amount of mixed echogenic plaque  involving the origin and proximal aspects of the left internal carotid artery (image 55), not resulting in elevated peak systolic velocities within the interrogated course of the left internal carotid artery to suggest a hemodynamically significant stenosis. LEFT VERTEBRAL ARTERY:  Antegrade flow Note is made of an apparent cardiac arrhythmia (representative image 28). IMPRESSION: 1. Minimal amount of bilateral atherosclerotic plaque, right greater than left, not resulting in a hemodynamically significant stenosis within either internal carotid artery. 2. Apparent cardiac arrhythmia. Further evaluation with ECG monitoring could be performed as indicated. Electronically Signed   By: Simonne Come M.D.   On: 06/19/2017 13:08   ASSESSMENT AND PLAN:  83 y.o. female with a known history of Hypertension hypertension, who lives at [facility] assisted living had a fall when she was going to bathroom today morning. Did not lose consciousness. Patient hit her head and  Landed on back.  She was brought into the hospital for further evaluation and management.  *Acute T4 compression fracture, possible L5 fracture: Seen on a CT of the thoracolumbar spine -Seen the orthopedic documents. Recommended conservative management with pain medications. Physical therapy and repeat imaging in a week if symptoms worsen. Patient has severe pain in even with little movement, continue tramadol, morphine, physical therapy.  Add muscle relaxants..  * Syncope likely due to dehydration;improved.  * Hyponatremia due to dehydration: Improved, discontinue IV fluids. * slightly elevated troponins, new onset atrial fibrillation: Likely supply demand ischemia secondary to dehydration.  - normal echocardiogram, monitor on telemetry.  D/w cardio,  * essential hypertension: Controlled, continue home dose medicines with Norvasc, Coreg . Elevated blood pressure today morning. Increase the Norvasc to 5 mg daily.  * Hypothyroidism: Continue  levothyroxine.   * history of urinary retention ; bladder scan as needed.   Physical therapy and occupational therapy evaluation   All the records are reviewed and case discussed with Care Management/Social Worker. Management plans discussed with the patient, family (son at bedside) and they are in agreement.  CODE STATUS: DNR  TOTAL TIME TAKING CARE OF THIS PATIENT: 35 minutes.   More than 50% of the time was spent in counseling/coordination of care: YES  POSSIBLE D/C IN 2-3 DAYS, DEPENDING ON CLINICAL CONDITION.   Katha Hamming M.D on 06/20/2017 at 11:31 AM  Between 7am to 6pm - Pager - [phone]  After 6pm go to [email or web address] - Social research officer, government  [facility]  Office  [phone]  CC: Primary care physician; Patient, No Pcp Per  Note: This dictation was prepared with Dragon dictation along with smaller phrase technology. Any transcriptional errors that result from this process are unintentional.

## 2017-06-21 LAB — GLUCOSE, CAPILLARY: GLUCOSE-CAPILLARY: 121 mg/dL — AB (ref 65–99)

## 2017-06-21 NOTE — Progress Notes (Signed)
Physical Therapy Treatment Patient Details Name: Sheri Webster MRN: [id number] DOB: [birth year] Today's Date: 06/21/2017    History of Present Illness Patient is a 83 y/o female that presents with severe low back pain. She was found to have acute L5 compression fx, severe stenosis at L3-L4, L4-L5. New a-fib likely in the presence of demand ischemia due to dehydration.     PT Comments    Patient continues to have some level of confusion and is unable to recall PT session yesterday nor multiple times staff has changed her today. She continues to be limited in mobility, though is able to tolerate and minimally participate in stand pivot transfer in this session. She requires significant level of assistance initially to sit at the edge of the bed (R lateral lean) and does not follow commands for hand placement or technique in standing and has her feet slide during transfer attempts. She is progressing in terms of tolerance for mobility, will still recommend SNF placement at this time at discharge.    Follow Up Recommendations  SNF     Equipment Recommendations       Recommendations for Other Services       Precautions / Restrictions Precautions Precautions: Back;Fall Precaution Booklet Issued: No Restrictions Weight Bearing Restrictions: No    Mobility  Bed Mobility Overal bed mobility: Needs Assistance Bed Mobility: Supine to Sit     Supine to sit: Max assist     General bed mobility comments: Patient is able to bring her LEs most of the way towards the edge of the bed, requires complete assistance in elevating torso.   Transfers Overall transfer level: Needs assistance Equipment used: Rolling walker (2 wheeled) Transfers: Sit to/from [employer] Sit to Stand: Max assist;+2 physical assistance Stand pivot transfers: Max assist       General transfer comment: PT and patient attempt multiple times to complete sit to stand, she is unable to follow commands  for hand placement or technique. Her feet continue to slide throughout transfer attempt. After several attempts, PT opted for stand pivot transfer, which required max A x1.  Ambulation/Gait                 Stairs            Wheelchair Mobility    Modified Rankin (Stroke Patients Only)       Balance Overall balance assessment: Needs assistance;History of Falls Sitting-balance support: Bilateral upper extremity supported;Feet supported Sitting balance-Leahy Scale: Fair   Postural control: Right lateral lean Standing balance support: Bilateral upper extremity supported Standing balance-Leahy Scale: Zero                              Cognition Arousal/Alertness: Awake/alert Behavior During Therapy: WFL for tasks assessed/performed Overall Cognitive Status: History of cognitive impairments - at baseline                                 General Comments: Patient does not recall working with therapist yesterday, or multiple gown changes today.       Exercises      General Comments        Pertinent Vitals/Pain Pain Assessment: Faces Faces Pain Scale: Hurts even more Pain Location: Low back Pain Descriptors / Indicators: Aching Pain Intervention(s): Limited activity within patient's tolerance;Monitored during session;Repositioned    Home Living  Prior Function            PT Goals (current goals can now be found in the care plan section) Acute Rehab PT Goals Patient Stated Goal: To reduce her pain PT Goal Formulation: With family Time For Goal Achievement: 07/04/17 Potential to Achieve Goals: Fair Progress towards PT goals: Progressing toward goals    Frequency    7X/week      PT Plan Current plan remains appropriate    Co-evaluation              AM-PAC PT "6 Clicks" Daily Activity  Outcome Measure  Difficulty turning over in bed (including adjusting bedclothes, sheets and  blankets)?: A Lot Difficulty moving from lying on back to sitting on the side of the bed? : A Lot Difficulty sitting down on and standing up from a chair with arms (e.g., wheelchair, bedside commode, etc,.)?: Unable Help needed moving to and from a bed to chair (including a wheelchair)?: Total Help needed walking in hospital room?: Total Help needed climbing 3-5 steps with a railing? : Total 6 Click Score: 8    End of Session Equipment Utilized During Treatment: Gait belt Activity Tolerance: Patient limited by pain Patient left: with call bell/phone within reach;with chair alarm set;in chair Nurse Communication: Mobility status PT Visit Diagnosis: Muscle weakness (generalized) (M62.81);History of falling (Z91.81)     Time:  -     Charges:  $Gait Training: 8-22 mins $Therapeutic Activity: 8-22 mins (Patient required assistance with rolling to complete bed linen change. )                    G Codes:      Alva Garnet PT, DPT, CSCS    06/21/2017, 3:54 PM

## 2017-06-21 NOTE — Clinical Social Work Note (Signed)
Clinical Social Work Assessment  Patient Details  Name: Sheri Webster MRN: [id number] Date of Birth: 1921/02/09  Date of referral:  06/21/17               Reason for consult:  Facility Placement                Permission sought to share information with:  Facility Art therapist granted to share information::  Yes, Verbal Permission Granted  Name::        Agency::     Relationship::     Contact Information:     Housing/Transportation Living arrangements for the past 2 months:  [place] of Information:  Patient, Medical Team Patient Interpreter Needed:  None Criminal Activity/Legal Involvement Pertinent to Current Situation/Hospitalization:  No - Comment as needed Significant Relationships:  Adult Children Lives with:  Facility Resident Do you feel safe going back to the place where you live?  Yes Need for family participation in patient care:  No (Coment)  Care giving concerns:  PT recommendation for STR/patient admitted from [facility] Worker assessment / plan:  The CSW met with the patient at bedside to discuss discharge planning. CSW explained the PT recommendation and the different levels of care. The patient indicated that she does not want any assistance due to cost. CSW reiterated that Medicare would cover her STR for 20 days including the hospital stay. The patient indicated that the CSW should contact her son.  CSW contacted the patient's son, Sheri Webster, who indicated that he was not pleased with being called on a Sunday. The CSW apologized and explained the reason for the call. Sheri Webster reported that he would not make a decision until speaking to a doctor, preferably the patient's cardiologist. CSW advised that Sheri Webster could discuss further with the CSW tomorrow. CSW has not made a STR referral due to lack of verbal permission for such. CSW will revisit with the patient and her son tomorrow for a discharge plan.  Employment  status:  Retired Forensic scientist:  [employer] PT Recommendations:  [place] / Referral to community resources:  [place]  Patient/Family's Response to care: The patient and her son thanked the CSW for assistance.  Patient/Family's Understanding of and Emotional Response to Diagnosis, Current Treatment, and Prognosis:  The patient and her son want clarification from a cardiologist before approving a discharge plan.  Emotional Assessment Appearance:  Appears younger than stated age Attitude/Demeanor/Rapport:  Suspicious (Pained) Affect (typically observed):  Blunt, Irritable Orientation:  Oriented to Self, Oriented to Place, Oriented to  Time, Oriented to Situation Alcohol / Substance use:  Never Used Psych involvement (Current and /or in the community):  No (Comment)  Discharge Needs  Concerns to be addressed:  Care Coordination, Discharge Planning Concerns Readmission within the last 30 days:  No Current discharge risk:  None Barriers to Discharge:  Continued Medical Work up   [employer], LCSW 06/21/2017, 3:20 PM

## 2017-06-21 NOTE — Progress Notes (Signed)
Patient sitting up, eating, appears much more comfortable, but has some pain.

## 2017-06-21 NOTE — Progress Notes (Signed)
1        [facility] [place] at [facility]   PATIENT NAME: Sheri Webster    MR#:  [id number]  DATE OF BIRTH:  1921/08/05  SUBJECTIVE: . Says that she has less back pain today.   CHIEF COMPLAINT:   Chief Complaint  Patient presents with  . Fall  In a lot of pain, has difficulty hearing.  Son at bedside REVIEW OF SYSTEMS:  Review of Systems  Constitutional: Positive for malaise/fatigue. Negative for chills, fever and weight loss.  HENT: Negative for nosebleeds and sore throat.   Eyes: Negative for blurred vision.  Respiratory: Negative for cough, shortness of breath and wheezing.   Cardiovascular: Negative for chest pain, orthopnea, leg swelling and PND.  Gastrointestinal: Negative for abdominal pain, constipation, diarrhea, heartburn, nausea and vomiting.  Genitourinary: Negative for dysuria and urgency.  Musculoskeletal: Positive for back pain and falls.  Skin: Negative for rash.  Neurological: Positive for weakness. Negative for dizziness, speech change, focal weakness and headaches.  Endo/Heme/Allergies: Does not bruise/bleed easily.  Psychiatric/Behavioral: Negative for depression.    DRUG ALLERGIES:   Allergies  Allergen Reactions  . Keflex [Cephalexin]    VITALS:  Blood pressure 140/70, pulse 74, temperature 97.6 F (36.4 C), temperature source Axillary, resp. rate 16, height 5\' 4"  (1.626 m), weight 57.8 kg (127 lb 6.4 oz), SpO2 95 %. PHYSICAL EXAMINATION:  Physical Exam  Constitutional: She is oriented to person, place, and time and well-developed, well-nourished, and in no distress.  HENT:  Head: Normocephalic and atraumatic.  Eyes: Pupils are equal, round, and reactive to light. Conjunctivae and EOM are normal.  Neck: Normal range of motion. Neck supple. No tracheal deviation present. No thyromegaly present.  Cardiovascular: Normal rate, regular rhythm and normal heart sounds.   Pulmonary/Chest: Effort normal and breath sounds normal. No  respiratory distress. She has no wheezes. She exhibits no tenderness.  Abdominal: Soft. Bowel sounds are normal. She exhibits no distension. There is no tenderness.  Musculoskeletal: Normal range of motion.       Thoracic back: She exhibits tenderness, bony tenderness and spasm.       Arms: Neurological: She is alert and oriented to person, place, and time. No cranial nerve deficit.  Skin: Skin is warm and dry. No rash noted.  Psychiatric: Mood and affect normal.   LABORATORY PANEL:  Female CBC  Recent Labs Lab 06/20/17 0422  WBC 12.2*  HGB 12.6  HCT 36.6  PLT 233   ------------------------------------------------------------------------------------------------------------------ Chemistries   Recent Labs Lab 06/18/17 1427  06/19/17 0813 06/20/17 0422  NA 129*  < >  --  134*  K 4.1  < >  --  4.2  CL 91*  < >  --  102  CO2 27  < >  --  24  GLUCOSE 119*  < >  --  115*  BUN 14  < >  --  12  CREATININE 0.69  < >  --  0.60  CALCIUM 9.5  < >  --  8.5*  MG  --   --  2.0  --   AST 34  --   --   --   ALT 13*  --   --   --   ALKPHOS 63  --   --   --   BILITOT 2.0*  --   --   --   < > = values in this interval not displayed. RADIOLOGY:  No results found. ASSESSMENT AND PLAN:  83 y.o. female with a known history of Hypertension hypertension, who lives at [facility] assisted living had a fall when she was going to bathroom . Did not lose consciousness. Patient hit her head and  Landed on back.  She was brought into the hospital for further evaluation and management.  *Acute T4 compression fracture, possible L5 fracture: Seen on a CT of the thoracolumbar spine -Seen the orthopedic  Recommended conservative management with pain medications. Physical therapy and repeat imaging in a week if symptoms worsen. Patient says that she is slightly better than yesterday. Continue pain medicines, was relaxants, physical therapy. Physical therapy recommended skilled nursing facility.  * Syncope  likely due to dehydration;improved.  * Hyponatremia due to dehydration: Improved, discontinue IV fluids. * slightly elevated troponins, new onset atrial fibrillation: Likely supply demand ischemia secondary to dehydration.  - normal echocardiogram, monitor on telemetry.  D/w cardio,  * essential hypertension: Controlled, continue home dose medicines with Norvasc, Coreg . Elevated blood pressure yesterday: BP better today. * Hypothyroidism: Continue levothyroxine.   * history of urinary retention ; bladder scan as needed.   PT therapy recommended skilled nursing facility. All the records are reviewed and case discussed with Care Management/Social Worker. Management plans discussed with the patient, family (son at bedside) and they are in agreement.  CODE STATUS: DNR  TOTAL TIME TAKING CARE OF THIS PATIENT: 35 minutes.   More than 50% of the time was spent in counseling/coordination of care: YES  POSSIBLE D/C IN 2-3 DAYS, DEPENDING ON CLINICAL CONDITION.   Katha Hamming M.D on 06/21/2017 at 11:41 AM  Between 7am to 6pm - Pager - [phone]  After 6pm go to [email or web address] - Social research officer, government  [facility] [place] [facility]  Office  [phone]  CC: Primary care physician; Patient, No Pcp Per  Note: This dictation was prepared with Dragon dictation along with smaller phrase technology. Any transcriptional errors that result from this process are unintentional.

## 2017-06-22 DIAGNOSIS — E860 Dehydration: Secondary | ICD-10-CM

## 2017-06-22 DIAGNOSIS — Z515 Encounter for palliative care: Secondary | ICD-10-CM

## 2017-06-22 DIAGNOSIS — M546 Pain in thoracic spine: Secondary | ICD-10-CM

## 2017-06-22 DIAGNOSIS — Z7189 Other specified counseling: Secondary | ICD-10-CM

## 2017-06-22 LAB — GLUCOSE, CAPILLARY
GLUCOSE-CAPILLARY: 115 mg/dL — AB (ref 65–99)
GLUCOSE-CAPILLARY: 140 mg/dL — AB (ref 65–99)
Glucose-Capillary: 118 mg/dL — ABNORMAL HIGH (ref 65–99)

## 2017-06-22 NOTE — Progress Notes (Signed)
   Remains in Afib, rate controlled. TTE essentially normal. No plans for restoring sinus rhythm as she is asymptomatic. Continue rate control as heart rate allows. Elevated troponin not felt to be primary ischemic event.

## 2017-06-22 NOTE — Progress Notes (Signed)
Patient appears much more comfortable but refusing to get up. non tender to palpation along spine for first time. Will continue to follow, but I doubt any procedure required for back

## 2017-06-22 NOTE — Progress Notes (Signed)
1        [facility] - [place] at [facility]   PATIENT NAME: Sheri Webster    MR#:  [id number]  DATE OF BIRTH:  February 23, 1921  She feels better today, less back pain. But very worried about insurance and skilled nursing facility placement.   CHIEF COMPLAINT:   Chief Complaint  Patient presents with  . Fall  In a lot of pain, has difficulty hearing.  Son at bedside REVIEW OF SYSTEMS:  Review of Systems  Constitutional: Positive for malaise/fatigue. Negative for chills, fever and weight loss.  HENT: Negative for nosebleeds and sore throat.   Eyes: Negative for blurred vision.  Respiratory: Negative for cough, shortness of breath and wheezing.   Cardiovascular: Negative for chest pain, orthopnea, leg swelling and PND.  Gastrointestinal: Negative for abdominal pain, constipation, diarrhea, heartburn, nausea and vomiting.  Genitourinary: Negative for dysuria and urgency.  Musculoskeletal: Positive for back pain and falls.  Skin: Negative for rash.  Neurological: Positive for weakness. Negative for dizziness, speech change, focal weakness and headaches.  Endo/Heme/Allergies: Does not bruise/bleed easily.  Psychiatric/Behavioral: Negative for depression.    DRUG ALLERGIES:   Allergies  Allergen Reactions  . Keflex [Cephalexin]    VITALS:  Blood pressure (!) 171/81, pulse 85, temperature 98 F (36.7 C), temperature source Oral, resp. rate 16, height 5\' 4"  (1.626 m), weight 57.3 kg (126 lb 6.4 oz), SpO2 96 %. PHYSICAL EXAMINATION:  Physical Exam  Constitutional: She is oriented to person, place, and time and well-developed, well-nourished, and in no distress.  HENT:  Head: Normocephalic and atraumatic.  Eyes: Pupils are equal, round, and reactive to light. Conjunctivae and EOM are normal.  Neck: Normal range of motion. Neck supple. No tracheal deviation present. No thyromegaly present.  Cardiovascular: Normal rate, regular rhythm and normal heart sounds.     Pulmonary/Chest: Effort normal and breath sounds normal. No respiratory distress. She has no wheezes. She exhibits no tenderness.  Abdominal: Soft. Bowel sounds are normal. She exhibits no distension. There is no tenderness.  Musculoskeletal: Normal range of motion.       Thoracic back: She exhibits tenderness, bony tenderness and spasm.       Arms: Neurological: She is alert and oriented to person, place, and time. No cranial nerve deficit.  Skin: Skin is warm and dry. No rash noted.  Psychiatric: Mood and affect normal.   LABORATORY PANEL:  Female CBC  Recent Labs Lab 06/20/17 0422  WBC 12.2*  HGB 12.6  HCT 36.6  PLT 233   ------------------------------------------------------------------------------------------------------------------ Chemistries   Recent Labs Lab 06/18/17 1427  06/19/17 0813 06/20/17 0422  NA 129*  < >  --  134*  K 4.1  < >  --  4.2  CL 91*  < >  --  102  CO2 27  < >  --  24  GLUCOSE 119*  < >  --  115*  BUN 14  < >  --  12  CREATININE 0.69  < >  --  0.60  CALCIUM 9.5  < >  --  8.5*  MG  --   --  2.0  --   AST 34  --   --   --   ALT 13*  --   --   --   ALKPHOS 63  --   --   --   BILITOT 2.0*  --   --   --   < > = values in this interval not  displayed. RADIOLOGY:  No results found. ASSESSMENT AND PLAN:  83 y.o. female with a known history of Hypertension hypertension, who lives at [facility] assisted living had a fall when she was going to bathroom . Did not lose consciousness. Patient hit her head and  Landed on back.  She was brought into the hospital for further evaluation and management.  *Acute T4 compression fracture, possible L5 fracture: Seen on a CT of the thoracolumbar spine -Seen the orthopedic  Recommended conservative management with pain medications. Physical therapy and repeat imaging in a week if symptoms worsen. Patient says that she is slightly better than yesterday. Continue pain medicines, was relaxants, physical therapy.  Physical therapy recommended skilled nursing facility.Likely she is tomorrow. Slowly improving, less back pain . * Syncope likely due to dehydration;improved.  * Hyponatremia due to dehydration: Improved, discontinue IV fluids. * slightly elevated troponins, new onset atrial fibrillation: Likely supply demand ischemia secondary to dehydration.  - normal echocardiogram, monitor on telemetry.  D/w cardio,  * essential hypertension: Controlled, continue home dose medicines with Norvasc, Coreg . Elevated blood pressure yesterday: BP better today. * Hypothyroidism: Continue levothyroxine.   * history of urinary retention ; bladder scan as needed.   PT therapy recommended skilled nursing facility. D/w son. All the records are reviewed and case discussed with Care Management/Social Worker. Management plans discussed with the patient, family (son at bedside) and they are in agreement.  CODE STATUS: DNR  TOTAL TIME TAKING CARE OF THIS PATIENT: 35 minutes.   More than 50% of the time was spent in counseling/coordination of care: YES  POSSIBLE D/C IN 2-3 DAYS, DEPENDING ON CLINICAL CONDITION.   Katha Hamming M.D on 06/22/2017 at 12:17 PM  Between 7am to 6pm - Pager - [phone]  After 6pm go to [email or web address] - Social research officer, government  [facility] [place] [facility]  Office  [phone]  CC: Primary care physician; Patient, No Pcp Per  Note: This dictation was prepared with Dragon dictation along with smaller phrase technology. Any transcriptional errors that result from this process are unintentional.

## 2017-06-22 NOTE — Consult Note (Signed)
Consultation Note Date: 06/22/17  Patient Name: Sheri Webster  DOB: September 18, 1921  MRN: [id number]  Age / Sex: 83 y.o., female  PCP: Patient, No Pcp Per Referring Physician: Epifanio Lesches, MD  Reason for Consultation: Establishing goals of care  HPI/Patient Profile: 83 y.o. female  with past medical history of hypertension, hyperlipidemia, arthritis, heart valve replacement admitted on 06/18/2017 with fall and low back pain from assisted living facility. In ED, CT head/neck unremarkable but with hyponatremia. Recent urinary retention followed outpatient by urology. Foley catheter removed 8/19. CT thoracolumbar spine revealed acute T4 compression fracture and possible L5 fracture. Followed by ortho. Recommending conservative management with pain management and physical therapy at SNF. Hyponatremia due to dehydration now improved and off IV fluids. Palliative medicine consultation for goals of care.   Clinical Assessment and Goals of Care: I have reviewed medical records, discussed with care team, and met with patient at bedside. Upon arrival to room, Sheri Webster is complaining of back pain. Repositioned her and notified RN to give prn tramadol. Patient is frustrated with being in the hospital. Seems to be having periods of confusion.   Introduced Palliative Medicine as specialized medical care for people living with serious illness. It focuses on providing relief from the symptoms and stress of a serious illness. The goal is to improve quality of life for both the patient and the family.  We discussed a brief life review of the patient. No spouse. Two children--one deceased. Son, Sheri Webster, looks after her. Patient tells me she was living home before she broke her foot (chart reviewed--this was in April). She has been living at [facility]. Prior to hospitalization, able to ambulate with walker but needing  assist with toileting/ADL's.   Discussed hospital diagnoses and conservative management for compression fractures with physical therapy and pain management. Patient tells me I need to talk with her son. "We need to figure out what to do." I explained she will go to rehab from the hospital. She tells me she is "so weak I can't walk." She does have a good appetite and drinking water.     **No family at bedside. Attempted to call son x2. No answer. Voicemail left.   Therapeutic listening and emotional support provided to patient.    SUMMARY OF RECOMMENDATIONS    DNR  Initial conversation with patient. No family at bedside. VM left for son.   If discharge tomorrow, may benefit from continued support from palliative services at rehab.   Code Status/Advance Care Planning:  DNR  Symptom Management:   Continue fentanyl patch 67mg q72h   Continue tramadol [id number] prn pain  Palliative Prophylaxis:   Aspiration, Delirium Protocol, Frequent Pain Assessment, Oral Care and Turn Reposition  Psycho-social/Spiritual:   Desire for further Chaplaincy support:yes  Additional Recommendations: Caregiving  Support/Resources  Prognosis:   Unable to determine  [place] care service [place]      Primary Diagnoses: Present on Admission: . Syncope   I have reviewed the  medical record, interviewed the patient and family, and examined the patient. The following aspects are pertinent.  Past Medical History:  Diagnosis Date  . Arthritis   . Hyperlipidemia   . Hypertension    Social History   Social History  . Marital status: Widowed    Spouse name: N/A  . Number of children: N/A  . Years of education: N/A   Social History Main Topics  . Smoking status: Former Smoker    Quit date: 11/04/1995  . Smokeless tobacco: Never Used  . Alcohol use No  . Drug use: No  . Sexual activity: Not Asked   Other Topics Concern  .  None   Social History Narrative  . None   Family History  Problem Relation Age of Onset  . Bladder Cancer Neg Hx   . Kidney cancer Neg Hx    Scheduled Meds: . amLODipine  5 mg Oral Daily  . aspirin EC  81 mg Oral BID  . docusate sodium  100 mg Oral BID  . enoxaparin (LOVENOX) injection  40 mg Subcutaneous Q24H  . fentaNYL  25 mcg Transdermal Q72H  . levothyroxine  88 mcg Oral QAC breakfast  . metaxalone  800 mg Oral TID  . pravastatin  40 mg Oral QPC supper  . traZODone  25 mg Oral QHS   Continuous Infusions: PRN Meds:.acetaminophen **OR** acetaminophen, bisacodyl, morphine injection, ondansetron **OR** ondansetron (ZOFRAN) IV, traMADol Medications Prior to Admission:  Prior to Admission medications   Medication Sig Start Date End Date Taking? Authorizing Provider  acetaminophen (TYLENOL) 500 MG tablet Take 500 mg by mouth every 6 (six) hours as needed.   Yes [provider]  amLODipine (NORVASC) 2.5 MG tablet Take 2.5 mg by mouth daily.   Yes [provider]  aspirin EC 81 MG tablet Take 81 mg by mouth 2 (two) times daily.    Yes [provider]  bisacodyl (DULCOLAX) 10 MG suppository Place 10 mg rectally as needed for moderate constipation.   Yes [provider]  calcium-vitamin D (OSCAL WITH D) 500-200 MG-UNIT tablet Take 1 tablet by mouth 2 (two) times daily.    Yes [provider]  carvedilol (COREG) 6.25 MG tablet Take 6.25 mg by mouth 2 (two) times daily with a meal.   Yes [provider]  cyclobenzaprine (FLEXERIL) 5 MG tablet Take 5 mg by mouth 3 (three) times daily as needed for muscle spasms.   Yes [provider]  ergocalciferol (VITAMIN D2) 50000 units capsule Take 50,000 Units by mouth once a week.   Yes [provider]  [place] (DERMACLOUD) CREA Apply topically.   Yes [provider]  levothyroxine (SYNTHROID, LEVOTHROID) 88 MCG tablet Take 88 mcg by mouth daily before  breakfast.   Yes [provider]  magnesium hydroxide (MILK OF MAGNESIA) 400 MG/5ML suspension Take by mouth daily as needed for mild constipation.   Yes [provider]  Melatonin 3 MG TABS Take 6 mg by mouth at bedtime.    Yes [provider]  nystatin (NYSTATIN) powder Apply topically 2 (two) times daily.    Yes [provider]  pravastatin (PRAVACHOL) 40 MG tablet Take 40 mg by mouth daily.   Yes [provider]  senna (SENOKOT) 8.6 MG tablet Take 2 tablets by mouth 2 (two) times daily.    Yes [provider]  traZODone (DESYREL) 50 MG tablet Take 25 mg by mouth at bedtime.    Yes [provider]   Allergies  Allergen Reactions  . Keflex [Cephalexin]    Review of Systems  Constitutional: Positive for activity change.       Fall  Neurological: Positive for weakness.   Physical Exam  Constitutional: She is oriented to person, place, and time. She is cooperative. She appears ill.  HENT:  Head: Normocephalic and atraumatic.  Cardiovascular: Regular rhythm.   Pulmonary/Chest: Effort normal. No accessory muscle usage. No tachypnea. No respiratory distress. She has decreased breath sounds.  Abdominal: Normal appearance.  Neurological: She is alert and oriented to person, place, and time.  Intermittent confusion  Skin: Skin is warm and dry. There is pallor.  Nursing note and vitals reviewed.  Vital Signs: BP (!) 152/74   Pulse 85   Temp 97.6 F (36.4 C) (Oral)   Resp 19   Ht _0  (1.626 m)   Wt 57.2 kg (126 lb)   SpO2 95%   BMI 21.63 kg/m  Pain Assessment: No/denies pain POSS *See Group Information*: 1-Acceptable,Awake and alert Pain Score: 0-No pain  SpO2: SpO2: 95 % O2 Device:SpO2: 95 % O2 Flow Rate: .   IO: Intake/output summary:   Intake/Output Summary (Last 24 hours) at 06/23/17 2550 Last data filed at 06/22/17 2100  Gross per 24 hour  Intake              120 ml  Output             1600 ml  Net             -1480 ml    LBM: Last BM Date:  (unknown) Baseline Weight: Weight: 61.2 kg (135 lb) Most recent weight: Weight: 57.2 kg (126 lb)     Palliative Assessment/Data:  PPS 30%   Flowsheet Rows     Most Recent Value  Intake Tab  Referral Department  Hospitalist  Unit at Time of Referral  Cardiac/Telemetry Unit  Palliative Care Primary Diagnosis  Other (Comment) [Fall/compression fractures]  Palliative Care Type  New Palliative care  Reason for referral  Clarify Goals of Care  Date first seen by Palliative Care  06/22/17  Clinical Assessment  Palliative Performance Scale Score  30%  Psychosocial & Spiritual Assessment  Palliative Care Outcomes  Patient/Family meeting held?  Yes  Who was at the meeting?  patient  Palliative Care Outcomes  Clarified goals of care, Provided psychosocial or spiritual support, ACP counseling assistance, Improved pain interventions, Improved non-pain symptom therapy      Time In: 1310 Time Out: 1410 Time Total: 14mn Greater than 50%  of this time was spent counseling and coordinating care related to the above assessment and plan.  Signed by:  MIhor Dow FNP-C Palliative Medicine Team  Phone: [phone]: [phone]  Please contact Palliative Medicine Team phone at [phone] questions and concerns.  For individual provider: See AShea Evans

## 2017-06-22 NOTE — Progress Notes (Signed)
Physical Therapy Treatment Patient Details Name: Sheri Webster MRN: [id number] DOB: Jul 09, 1921 Today's Date: 06/22/2017    History of Present Illness Patient is a 83 y/o female that presents with severe low back pain. She was found to have acute L5 compression fx, severe stenosis at L3-L4, L4-L5. New a-fib likely in the presence of demand ischemia due to dehydration.     PT Comments    Pt agreeable to PT; reports back pain 8/10. Pt refuses transfer to chair; offered several times. Pt does participate in supine bed exercises with assist as needed. PT with decreased memory and repeats several questions regarding performance of exercises with little evidence of retaining information with re instruction. Pt repositioned in bed for improved comfort and support for ability to eat lunch comfortably and "more like at home", as pt states. Continue PT to progress endurance, strength to improve functional mobility.    Follow Up Recommendations  SNF     Equipment Recommendations       Recommendations for Other Services       Precautions / Restrictions Precautions Precautions: Back;Fall Restrictions Weight Bearing Restrictions: No    Mobility  Bed Mobility               General bed mobility comments: Not tested; pt refuses out of bed  Transfers                    Ambulation/Gait                 Stairs            Wheelchair Mobility    Modified Rankin (Stroke Patients Only)       Balance                                            Cognition Arousal/Alertness: Awake/alert Behavior During Therapy: WFL for tasks assessed/performed Overall Cognitive Status: History of cognitive impairments - at baseline                                        Exercises General Exercises - Lower Extremity Ankle Circles/Pumps: AROM;Both;15 reps;Supine Quad Sets: Strengthening;Both;15 reps;Supine Gluteal Sets: Strengthening;Both;15  reps;Supine Short Arc Quad: AROM;Both;Supine;10 reps Heel Slides: AROM;Both;10 reps;Supine Hip ABduction/ADduction: AAROM;Both;10 reps;Supine Straight Leg Raises: AAROM;Both;10 reps;Supine    General Comments        Pertinent Vitals/Pain Pain Assessment: 0-10 Pain Score: 8  Pain Location: Low back Pain Intervention(s): Limited activity within patient's tolerance;Repositioned    Home Living                      Prior Function            PT Goals (current goals can now be found in the care plan section) Progress towards PT goals: Not progressing toward goals - comment    Frequency    7X/week      PT Plan Current plan remains appropriate    Co-evaluation              AM-PAC PT "6 Clicks" Daily Activity  Outcome Measure  Difficulty turning over in bed (including adjusting bedclothes, sheets and blankets)?: Unable Difficulty moving from lying on back to sitting on the side of the bed? : Unable Difficulty sitting  down on and standing up from a chair with arms (e.g., wheelchair, bedside commode, etc,.)?: Unable Help needed moving to and from a bed to chair (including a wheelchair)?: Total Help needed walking in hospital room?: Total Help needed climbing 3-5 steps with a railing? : Total 6 Click Score: 6    End of Session   Activity Tolerance: Patient tolerated treatment well;Patient limited by pain Patient left: in bed;with call bell/phone within reach;with bed alarm set   PT Visit Diagnosis: Muscle weakness (generalized) (M62.81);History of falling (Z91.81)     Time: 4037-0964 PT Time Calculation (min) (ACUTE ONLY): 24 min  Charges:  $Therapeutic Exercise: 23-37 mins                    G Codes:        Scot Dock, PTA 06/22/2017, 12:40 PM

## 2017-06-23 ENCOUNTER — Encounter: Payer: Self-pay | Admitting: *Deleted

## 2017-06-23 DIAGNOSIS — Z7189 Other specified counseling: Secondary | ICD-10-CM

## 2017-06-23 DIAGNOSIS — W19XXXA Unspecified fall, initial encounter: Secondary | ICD-10-CM

## 2017-06-23 DIAGNOSIS — R55 Syncope and collapse: Secondary | ICD-10-CM

## 2017-06-23 DIAGNOSIS — M546 Pain in thoracic spine: Secondary | ICD-10-CM

## 2017-06-23 DIAGNOSIS — Z515 Encounter for palliative care: Secondary | ICD-10-CM

## 2017-06-23 DIAGNOSIS — E860 Dehydration: Secondary | ICD-10-CM

## 2017-06-23 LAB — GLUCOSE, CAPILLARY: Glucose-Capillary: 125 mg/dL — ABNORMAL HIGH (ref 65–99)

## 2017-06-23 MED ORDER — AMLODIPINE BESYLATE 5 MG PO TABS: 5.0000 mg | ORAL_TABLET | Freq: Every day | ORAL | 0 refills | Status: DC

## 2017-06-23 MED ORDER — TRAMADOL HCL 50 MG PO TABS: 50.0000 mg | ORAL_TABLET | Freq: Four times a day (QID) | ORAL | 0 refills | Status: DC | PRN

## 2017-06-23 MED ORDER — METAXALONE 800 MG PO TABS: 800.0000 mg | ORAL_TABLET | Freq: Three times a day (TID) | ORAL | 0 refills | Status: DC

## 2017-06-23 MED ORDER — FENTANYL 25 MCG/HR TD PT72: 25.0000 ug | MEDICATED_PATCH | TRANSDERMAL | 0 refills | Status: DC

## 2017-06-23 NOTE — Progress Notes (Signed)
1        [facility] [place] [facility]   PATIENT NAME: Sheri Webster    MR#:  [id number]  DATE OF BIRTH:  [birth year]  She feels better today, less back pain. But very worried about insurance and skilled nursing facility placement.   CHIEF COMPLAINT:   Chief Complaint  Patient presents with  . Fall  In a lot of pain, has difficulty hearing.  Son at bedside REVIEW OF SYSTEMS:  Review of Systems  Constitutional: Positive for malaise/fatigue. Negative for chills, fever and weight loss.  HENT: Negative for nosebleeds and sore throat.   Eyes: Negative for blurred vision.  Respiratory: Negative for cough, shortness of breath and wheezing.   Cardiovascular: Negative for chest pain, orthopnea, leg swelling and PND.  Gastrointestinal: Negative for abdominal pain, constipation, diarrhea, heartburn, nausea and vomiting.  Genitourinary: Negative for dysuria and urgency.  Musculoskeletal: Positive for back pain and falls.  Skin: Negative for rash.  Neurological: Positive for weakness. Negative for dizziness, speech change, focal weakness and headaches.  Endo/Heme/Allergies: Does not bruise/bleed easily.  Psychiatric/Behavioral: Negative for depression.    DRUG ALLERGIES:   Allergies  Allergen Reactions  . Keflex [Cephalexin]    VITALS:  Blood pressure (!) 159/77, pulse 92, temperature 97.8 F (36.6 C), temperature source Oral, resp. rate 18, height 5\' 4"  (1.626 m), weight 57.2 kg (126 lb), SpO2 97 %. PHYSICAL EXAMINATION:  Physical Exam  Constitutional: She is oriented to person, place, and time and well-developed, well-nourished, and in no distress.  HENT:  Head: Normocephalic and atraumatic.  Eyes: Pupils are equal, round, and reactive to light. Conjunctivae and EOM are normal.  Neck: Normal range of motion. Neck supple. No tracheal deviation present. No thyromegaly present.  Cardiovascular: Normal rate, regular rhythm and normal heart sounds.     Pulmonary/Chest: Effort normal and breath sounds normal. No respiratory distress. She has no wheezes. She exhibits no tenderness.  Abdominal: Soft. Bowel sounds are normal. She exhibits no distension. There is no tenderness.  Musculoskeletal: Normal range of motion.       Thoracic back: She exhibits tenderness, bony tenderness and spasm.       Arms: Neurological: She is alert and oriented to person, place, and time. No cranial nerve deficit.  Skin: Skin is warm and dry. No rash noted.  Psychiatric: Mood and affect normal.   LABORATORY PANEL:  Female CBC  Recent Labs Lab 06/20/17 0422  WBC 12.2*  HGB 12.6  HCT 36.6  PLT 233   ------------------------------------------------------------------------------------------------------------------ Chemistries   Recent Labs Lab 06/18/17 1427  06/19/17 0813 06/20/17 0422  NA 129*  < >  --  134*  K 4.1  < >  --  4.2  CL 91*  < >  --  102  CO2 27  < >  --  24  GLUCOSE 119*  < >  --  115*  BUN 14  < >  --  12  CREATININE 0.69  < >  --  0.60  CALCIUM 9.5  < >  --  8.5*  MG  --   --  2.0  --   AST 34  --   --   --   ALT 13*  --   --   --   ALKPHOS 63  --   --   --   BILITOT 2.0*  --   --   --   < > = values in this interval not displayed. RADIOLOGY:  No results found. ASSESSMENT AND PLAN:  83 y.o. female with a known history of Hypertension hypertension, who lives at [facility] assisted [facility] had a fall when she was going to bathroom . Did not lose consciousness. Patient hit her head and  Landed on back.  She was brought into the hospital for further evaluation and management.  *Acute T4 compression fracture, possible L5 fracture: Seen on a CT of the thoracolumbar spine -Seen the orthopedic  Recommended conservative management with pain medications. Physical therapy and repeat imaging in a week if symptoms worsen. Patient says that she is slightly better than yesterday. Continue pain medicines, fentanyl patch,, physical therapy  recommends SNF,the patient  Is ready for discharge today but SW could not reach son  Multiple times.. * Syncope likely due to dehydration;improved.echo showed EF more than 55%.Carotids showed no hemodynamically significant stenosis.  * Hyponatremia due to dehydration: Improved, discontinue IV fluids. * slightly elevated troponins, new onset atrial fibrillation: Likely supply demand ischemia secondary to dehydration.  - normal echocardiogram, monitor on telemetry.  D/w cardio,  * essential hypertension: Controlled, continue home dose medicines with Norvasc, Coreg .Afib ;seen by cardio;she is on coreg,ASA.not symptomatic,no further work up..no full dose AC due to advanced age.and risk of falls and pt already had fall  And admitted for fall this time.  * Hypothyroidism: Continue levothyroxine.   * history of urinary retention ; bladder scan as needed.   PT therapy recommended skilled nursing facility. Unable to reach son today. All the records are reviewed and case discussed with Care Management/Social Worker. Management plans discussed with the patient, family (son at bedside) and they are in agreement.  CODE STATUS: DNR  TOTAL TIME TAKING CARE OF THIS PATIENT: 35 minutes.   More than 50% of the time was spent in counseling/coordination of care: YES  POSSIBLE D/C IN 2-3 DAYS, DEPENDING ON CLINICAL CONDITION.   Katha Hamming M.D on 06/23/2017 at 7:20 PM  Between 7am to 6pm - Pager - [phone]  After 6pm go to [email or web address] - Social research officer, government  [facility]  Office  [phone]  CC: Primary care physician; Patient, No Pcp Per  Note: This dictation was prepared with Dragon dictation along with smaller phrase technology. Any transcriptional errors that result from this process are unintentional.

## 2017-06-23 NOTE — [place] FL2 (Signed)
[place] MEDICAID [id number] LEVEL OF CARE SCREENING TOOL     IDENTIFICATION  Patient Name: Sheri Webster Birthdate: 06-14-1921 Sex: female Admission Date (Current Location): 06/18/2017  [place] and [place] Number:  Chiropodist and Address:  [facility], [address] [place], [place] [address]      Provider Number: [id number]  Attending Physician Name and Address:  Katha Hamming, MD  Relative Name and Phone Number:  Katera, Rybka [phone]     Current Level of Care: Hospital Recommended Level of Care: Skilled Nursing Facility Prior Approval Number:    Date Approved/Denied:   PASRR Number: [id number]  Discharge Plan: SNF    Current Diagnoses: Patient Active Problem List   Diagnosis Date Noted  . Fall   . Dehydration   . Acute midline thoracic back pain   . Palliative care by specialist   . Goals of care, counseling/discussion   . Syncope 06/18/2017    Orientation RESPIRATION BLADDER Height & Weight     Self, Situation, Place  Normal Indwelling catheter Weight: 126 lb (57.2 kg) Height:  5\' 4"  (162.6 cm)  BEHAVIORAL SYMPTOMS/MOOD NEUROLOGICAL BOWEL NUTRITION STATUS      Continent Diet (Dysphagia 3 diet pureed)  AMBULATORY STATUS COMMUNICATION OF NEEDS Skin   Extensive Assist Verbally Bruising                       Personal Care Assistance Level of Assistance  Bathing, Feeding, Dressing Bathing Assistance: Limited assistance Feeding assistance: Independent Dressing Assistance: Limited assistance     Functional Limitations Info  Hearing, Sight, Speech Sight Info: Adequate Hearing Info: Impaired Speech Info: Adequate    SPECIAL CARE FACTORS FREQUENCY  PT (By licensed PT)     PT Frequency: 5x a week              Contractures Contractures Info: Not present    Additional Factors Info  Code Status, Allergies Code Status Info: DNR Allergies Info: Keflex Cephalexin           Current  Medications (06/23/2017):  This is the current hospital active medication list Current Facility-Administered Medications  Medication Dose Route Frequency Provider Last Rate Last Dose  . acetaminophen (TYLENOL) tablet 650 mg  650 mg Oral Q6H PRN Katha Hamming, MD   650 mg at 06/19/17 1555   Or  . acetaminophen (TYLENOL) suppository 650 mg  650 mg Rectal Q6H PRN Katha Hamming, MD      . amLODipine (NORVASC) tablet 5 mg  5 mg Oral Daily Katha Hamming, MD   5 mg at 06/23/17 0942  . aspirin EC tablet 81 mg  81 mg Oral BID Katha Hamming, MD   81 mg at 06/23/17 0942  . bisacodyl (DULCOLAX) EC tablet 5 mg  5 mg Oral Daily PRN Katha Hamming, MD   5 mg at 06/20/17 0858  . docusate sodium (COLACE) capsule 100 mg  100 mg Oral BID Katha Hamming, MD   100 mg at 06/23/17 0942  . enoxaparin (LOVENOX) injection 40 mg  40 mg Subcutaneous Q24H Katha Hamming, MD   40 mg at 06/22/17 1812  . fentaNYL (DURAGESIC - dosed mcg/hr) patch 25 mcg  25 mcg Transdermal Q72H Delfino Lovett, MD   25 mcg at 06/22/17 1812  . levothyroxine (SYNTHROID, LEVOTHROID) tablet 88 mcg  88 mcg Oral QAC breakfast Katha Hamming, MD   88 mcg at 06/23/17 0818  . metaxalone (SKELAXIN) tablet 800 mg  800 mg Oral TID Luberta Mutter,  Snehalatha, MD   800 mg at 06/23/17 0942  . morphine 2 MG/ML injection 2 mg  2 mg Intravenous Q4H PRN Delfino Lovett, MD      . ondansetron (ZOFRAN) tablet 4 mg  4 mg Oral Q6H PRN Katha Hamming, MD       Or  . ondansetron (ZOFRAN) injection 4 mg  4 mg Intravenous Q6H PRN Katha Hamming, MD      . pravastatin (PRAVACHOL) tablet 40 mg  40 mg Oral QPC supper Katha Hamming, MD   40 mg at 06/22/17 1812  . traMADol (ULTRAM) tablet 50 mg  50 mg Oral Q6H PRN Delfino Lovett, MD   50 mg at 06/22/17 1337  . traZODone (DESYREL) tablet 25 mg  25 mg Oral QHS Katha Hamming, MD   25 mg at 06/22/17 2124     Discharge Medications: Please see discharge summary for a  list of discharge medications.  Relevant Imaging Results:  Relevant Lab Results:   Additional Information SS# [id number]  [place]

## 2017-06-23 NOTE — Progress Notes (Signed)
Physical Therapy Treatment Patient Details Name: Sheri Webster MRN: [id number] DOB: August 08, 1921 Today's Date: 06/23/2017    History of Present Illness Patient is a 83 y/o female that presents with severe low back pain. She was found to have acute L5 compression fx, severe stenosis at L3-L4, L4-L5. New a-fib likely in the presence of demand ischemia due to dehydration.     PT Comments    Participated in exercises as described below.  To edge of bed with min a x 1.  Sitting with min guard x 1.  She was able to stand with mod a x 2 and generally unsteady with walker.  She pivoted with walker and mod a x 2 with overall poor transfer.  While she was able to march in place fairly well, she had difficulty moving her feet towards the chair and needed physical assist to guide her into the chair.  Pt c/o the most pain with weight bearing on LE's.  Son in for session and answered questions as appropriate.  He stated he was ok with SNF for rehab which remains appropriate.   Follow Up Recommendations  SNF     Equipment Recommendations       Recommendations for Other Services       Precautions / Restrictions Precautions Precautions: Back;Fall Restrictions Weight Bearing Restrictions: No    Mobility  Bed Mobility Overal bed mobility: Needs Assistance Bed Mobility: Supine to Sit     Supine to sit: Min assist     General bed mobility comments: significant improvement today supine to sit  Transfers Overall transfer level: Needs assistance Equipment used: Rolling walker (2 wheeled) Transfers: Sit to/from [employer] Sit to Stand: Mod assist;+2 physical assistance Stand pivot transfers: Mod assist;+2 physical assistance       General transfer comment: stands with assist but has difficulty moving feet to right and needs physical assist for support and to turn.  Ambulation/Gait Ambulation/Gait assistance: Mod assist;+2 physical assistance Ambulation Distance (Feet): 2  Feet Assistive device: Rolling walker (2 wheeled) Gait Pattern/deviations: Step-to pattern     General Gait Details: generally unsteady and +2 assist for safety   Stairs            Wheelchair Mobility    Modified Rankin (Stroke Patients Only)       Balance Overall balance assessment: Needs assistance;History of Falls Sitting-balance support: Bilateral upper extremity supported;Feet supported Sitting balance-Leahy Scale: Fair     Standing balance support: Bilateral upper extremity supported Standing balance-Leahy Scale: Poor Standing balance comment: +2 assist needed for safety                            Cognition Arousal/Alertness: Awake/alert Behavior During Therapy: WFL for tasks assessed/performed Overall Cognitive Status: Within Functional Limits for tasks assessed                                        Exercises General Exercises - Lower Extremity Ankle Circles/Pumps: AROM;Both;15 reps;Supine Quad Sets: Strengthening;Both;15 reps;Supine Gluteal Sets: Strengthening;Both;15 reps;Supine Short Arc Quad: AROM;Both;Supine;10 reps Heel Slides: AROM;Both;10 reps;Supine Hip ABduction/ADduction: AAROM;Both;10 reps;Supine Straight Leg Raises: AAROM;Both;10 reps;Supine    General Comments        Pertinent Vitals/Pain Pain Score: 8  Pain Location: Low back - increases with mobility Pain Descriptors / Indicators: Aching Pain Intervention(s): Limited activity within patient's tolerance;Repositioned  Home Living                      Prior Function            PT Goals (current goals can now be found in the care plan section) Progress towards PT goals: Progressing toward goals    Frequency    7X/week      PT Plan Current plan remains appropriate    Co-evaluation              AM-PAC PT "6 Clicks" Daily Activity  Outcome Measure  Difficulty turning over in bed (including adjusting bedclothes, sheets and  blankets)?: A Little   Difficulty sitting down on and standing up from a chair with arms (e.g., wheelchair, bedside commode, etc,.)?: Unable Help needed moving to and from a bed to chair (including a wheelchair)?: A Lot Help needed walking in hospital room?: Total Help needed climbing 3-5 steps with a railing? : Total 6 Click Score: 8    End of Session Equipment Utilized During Treatment: Gait belt Activity Tolerance: Patient tolerated treatment well;Patient limited by pain Patient left: in chair;with chair alarm set;with call bell/phone within reach;with family/visitor present         Time: 0902-0917 PT Time Calculation (min) (ACUTE ONLY): 15 min  Charges:  $Therapeutic Exercise: 8-22 mins                    G Codes:       Danielle Dess, PTA 06/23/17, 9:39 AM

## 2017-06-23 NOTE — Progress Notes (Signed)
New referral for Palliative to follow at SNF received from CSW Windell Moulding. Facility not known at this time. Patient information faxed to referral. Minerva Areola to advise facility when family makes bed choice. Thank you. Dayna Barker RN, BSN, [facility] [place], hospital liaison [phone] c

## 2017-06-23 NOTE — Progress Notes (Signed)
Pt not discharged because SW  Was unable to reach son about bed offers for SNF.

## 2017-06-23 NOTE — Progress Notes (Addendum)
Patient is alert to self only. External cath in place. Up in chair for a short period today. Patient gets obsessive about being wet and is checked, found not to be wet. Can be argumentative at times. SCDs and TEDs placed. Bed alarm on for safety. Patient is able to feed herself without difficultly. Requests nursing to sit in the room all day, tried to explain to patient that this is not possible. Inquired about having family or friends come to stay with patient. Son unable to reach during different times during the day.

## 2017-06-23 NOTE — Progress Notes (Signed)
Daily Progress Note   Patient Name: Sheri Webster       Date: 06/23/2017 DOB: [birth year]  Age: 83 y.o. MRN#: [id number] Attending Physician: Katha Hamming, MD Primary Care Physician: Patient, No Pcp Per Admit Date: 06/18/2017  Reason for Consultation/Follow-up: Establishing goals of care  Subjective/GOC: Follow-up with patient. She is awake, alert, and oriented this AM. Sitting in chair. Pain controlled. C/o of not being able to have a bowel movement since admission. Receiving colace BID. Notified RN to give prn dulcolax.   Son, [name] at bedside. Introduced palliative care. Discussed hospital diagnoses and interventions including conservative management with pain medications and physical therapy. He expresses a decline in her health since December when she became weaker and requiring a walker for ambulation. He feels it has been hard for her to lose her independence now that she is living in assisted living. Also very lonely and wanting frequent visitors. [name] tells me she has a good appetite and maintaining weight. They are hopeful for recovery from physical therapy. Educated on palliative services to follow at SNF to further support and follow-up on pain management. Patient/son agreeable.   Therapeutic listening and emotional support provided.   Length of Stay: 5  Current Medications: Scheduled Meds:  . amLODipine  5 mg Oral Daily  . aspirin EC  81 mg Oral BID  . docusate sodium  100 mg Oral BID  . enoxaparin (LOVENOX) injection  40 mg Subcutaneous Q24H  . fentaNYL  25 mcg Transdermal Q72H  . levothyroxine  88 mcg Oral QAC breakfast  . metaxalone  800 mg Oral TID  . pravastatin  40 mg Oral QPC supper  . traZODone  25 mg Oral QHS    Continuous Infusions:  PRN  Meds: acetaminophen **OR** acetaminophen, bisacodyl, morphine injection, ondansetron **OR** ondansetron (ZOFRAN) IV, traMADol  Physical Exam  Constitutional: She is oriented to person, place, and time. She is cooperative.  HENT:  Head: Normocephalic and atraumatic.  Cardiovascular: Regular rhythm.   Pulmonary/Chest: Effort normal.  Abdominal: Normal appearance and bowel sounds are normal. There is no tenderness.  Neurological: She is alert and oriented to person, place, and time.  Skin: Skin is warm and dry. There is pallor.  Psychiatric: She has a normal mood and affect. Her speech is normal and behavior is normal.  Nursing note and  vitals reviewed.          Vital Signs: BP (!) 152/74   Pulse 85   Temp 97.6 F (36.4 C) (Oral)   Resp 19   Ht 5\' 4"  (1.626 m)   Wt 57.2 kg (126 lb)   SpO2 95%   BMI 21.63 kg/m  SpO2: SpO2: 95 % O2 Device: O2 Device: Not Delivered O2 Flow Rate:    Intake/output summary:   Intake/Output Summary (Last 24 hours) at 06/23/17 1022 Last data filed at 06/23/17 1610  Gross per 24 hour  Intake              120 ml  Output             1000 ml  Net             -880 ml   LBM: Last BM Date:  (unknown) Baseline Weight: Weight: 61.2 kg (135 lb) Most recent weight: Weight: 57.2 kg (126 lb)       Palliative Assessment/Data: PPS 50%   Flowsheet Rows     Most Recent Value  Intake Tab  Referral Department  Hospitalist  Unit at Time of Referral  Cardiac/Telemetry Unit  Palliative Care Primary Diagnosis  Other (Comment) [Fall/compression fractures]  Palliative Care Type  New Palliative care  Reason for referral  Clarify Goals of Care  Date first seen by Palliative Care  06/22/17  Clinical Assessment  Palliative Performance Scale Score  50%  Psychosocial & Spiritual Assessment  Palliative Care Outcomes  Patient/Family meeting held?  Yes  Who was at the meeting?  patient and son  Palliative Care Outcomes  Clarified goals of care, Provided psychosocial  or spiritual support, ACP counseling assistance, Linked to palliative care logitudinal support, Improved pain interventions, Improved non-pain symptom therapy      Patient Active Problem List   Diagnosis Date Noted  . Fall   . Dehydration   . Acute midline thoracic back pain   . Palliative care by specialist   . Goals of care, counseling/discussion   . Syncope 06/18/2017    Palliative Care Assessment & Plan   Patient Profile: 83 y.o. female  with past medical history of hypertension, hyperlipidemia, arthritis, heart valve replacement admitted on 06/18/2017 with fall and low back pain from assisted living facility. In ED, CT head/neck unremarkable but with hyponatremia. Recent urinary retention followed outpatient by urology. Foley catheter removed 8/19. CT thoracolumbar spine revealed acute T4 compression fracture and possible L5 fracture. Followed by ortho. Recommending conservative management with pain management and physical therapy at SNF. Hyponatremia due to dehydration now improved and off IV fluids. Palliative medicine consultation for goals of care.   Assessment: Acute T4 compression fracture with possible L5 fracture Back pain Syncope Dehydration resolved Hyponatremia resolved Essential hypertension Hx of urinary retention  Recommendations/Plan:  DNR  Clinical improvement. Pain controlled with current regimen.   Continue colace BID. Instructed RN to give prn dulcolax.   Plan is SNF for rehab.   Patient/son agreeable with palliative services to continue to follow on d/c.   Goals of Care and Additional Recommendations:  Limitations on Scope of Treatment: Full Scope Treatment  Code Status: DNR   Code Status Orders        Start     Ordered   06/19/17 1044  Do not attempt resuscitation (DNR)  Continuous    Question Answer Comment  In the event of cardiac or respiratory ARREST Do not call a "code blue"   In the  event of cardiac or respiratory ARREST Do not  perform Intubation, CPR, defibrillation or ACLS   In the event of cardiac or respiratory ARREST Use medication by any route, position, wound care, and other measures to relive pain and suffering. May use oxygen, suction and manual treatment of airway obstruction as needed for comfort.      06/19/17 1043    Code Status History    Date Active Date Inactive Code Status Order ID Comments User Context   06/18/2017  3:47 PM 06/19/2017 10:43 AM Full Code [id number]  Katha Hamming, MD ED       Prognosis:   Unable to determine  Discharge Planning:  Skilled Nursing Facility for rehab with Palliative care service follow-up  Care plan was discussed with patient, son, RN, SW  Thank you for allowing the Palliative Medicine Team to assist in the care of this patient.   Time In: 0945 Time Out: 1020 Total Time Prolonged Time Billed  no      Greater than 50%  of this time was spent counseling and coordinating care related to the above assessment and plan.  Vennie Homans, FNP-C Palliative Medicine Team  Phone: [phone] Fax: [phone]  Please contact Palliative Medicine Team phone at [phone] for questions and concerns.

## 2017-06-23 NOTE — Plan of Care (Signed)
Problem: Pain Managment: Goal: General experience of comfort will improve Outcome: Progressing No complaints of pain this shift, will continue to monitor.   

## 2017-06-23 NOTE — Clinical Social Work Note (Addendum)
12:15pm CSW attempted to contact patient's son.  CSW left message with patient's son awaiting call back.  2:20pm  CSW attempted to contact patient's son again to present bed offers for SNF.  Left another message awaiting call back.  3:21pm  CSW attempted to contact patient's son again to present bed offers for SNF placement.  Left another message awaiting for call back.  Ervin Knack. Sunday Klos, MSW, Theresia Majors [phone]  06/23/2017 2:21 PM

## 2017-06-23 NOTE — Discharge Summary (Addendum)
Sheri Webster, is a 83 y.o. female  DOB 1921-10-11  MRN [id number].  Admission date:  06/18/2017  Admitting Physician  Katha Hamming, MD  Discharge Date:  06/24/2017  Primary MD  Patient, No Pcp Per  Recommendations for primary care physician for things to follow:   Patient can follow up with Dr.Menz(ortho)in 2-3 weeks after the discharge.   Admission Diagnosis  Dehydration [E86.0] Back pain [M54.9] Syncope [R55] Elevated troponin [R74.8] Injury of head, initial encounter [S09.90XA] Fall, initial encounter [W19.XXXA] Syncope, unspecified syncope type [R55] Atrial fibrillation, unspecified type [facility]) [I48.91]   Discharge Diagnosis  Dehydration [E86.0] Back pain [M54.9] Syncope [R55] Elevated troponin [R74.8] Injury of head, initial encounter [S09.90XA] Fall, initial encounter [W19.XXXA] Syncope, unspecified syncope type [R55] Atrial fibrillation, unspecified type (HCC) [I48.91]    Active Problems:   Syncope   Fall   Dehydration   Acute midline thoracic back pain   Palliative care by specialist   Goals of care, counseling/discussion      Past Medical History:  Diagnosis Date  . Arthritis   . Hyperlipidemia   . Hypertension     Past Surgical History:  Procedure Laterality Date  . ABDOMINAL HYSTERECTOMY    . CARDIAC SURGERY  2004   heart valve replacement       History of present illness and  Hospital Course:     Kindly see H&P for history of present illness and admission details, please review complete Labs, Consult reports and Test reports for all details in brief  HPI  from the history and physical done on the day of admission 83 year old female patient with history of essential  hypertension came in from [place] because of the fall and found to have acute T4 compression fracture, L5  compression fracture, , hyponatremia.   Hospital Course  #1 hyponatremia due to dehydration: Improved with IV hydration. Sodium was at 129 admission improved to 134.  #2 severe hypokalemia secondary to dehydration: Potassium improved from 2.9-4.2. After replacement.  #3 history of fall. Back pain: Patient had a CAT scan of Lumbar spine which showed L5 compression fracture. Seen by orthopedic. Patient fell and hit her birth at [place]. Patient had lots of pain and tenderness diffusely over her spine. Dr. Rosita Kea did not recommend any surgery because of her age and also diffuse tenderness in her spine. Patient started on IV morphine, Skelaxin, and fentanyl patch. Patient pain levels are much improved, able to stand with moderate assistance, able to sit with minimal assistance. Heart and recliner for almost 4 hours today. She will be discharged to skilled nursing when the arrangements are made. Can continue fentanyl patch, tramadol for pain control. Patient can follow up with Dr. Lennice Sites weeks for further treatment options. Fosamax prescribed. Can f/u with Dr Rosita Kea if still having pain down the line.    #2 history of urinary retention: Improved. Foley catheter was recently more recently by urologist at the office.  #3 essential hypertension: BP was elevated during hospital stay so we increased the Norvasc to 5 mg daily instead of 2.5 MG that she takes at home. Continue Coreg.  #4 hypothyroidism: Continue Synthroid.  #5 constipation: Use.As needed. Fllet enema and suppository prescribed prior to discharge  6. Syncope: Patient echo showed EF more than 50% seen by cardiology. Patient was slightly elevated troponins due to lack the farm supply demand ischemia. Initial EKG on admission showed atrial fibrillation. Patient is on Coreg for rate control. And on aspirin for anticoagulation.  Patient carotid  ultrasound  showed no hemodynamically significant stenosis.     Patient/son agreeable with  palliative services to continue to follow on d/c  7. Hematome right forehead. Discharge Condition: stable   Follow UP follow up with the orthopedic. In 2 weeks.      Discharge Instructions  and  Discharge Medications     Allergies as of 06/24/2017      Reactions   Keflex [cephalexin]       Medication List    TAKE these medications   acetaminophen 500 MG tablet Commonly known as:  TYLENOL Take 500 mg by mouth every 6 (six) hours as needed.   alendronate 70 MG tablet Commonly known as:  FOSAMAX Take 1 tablet (70 mg total) by mouth once a week. Take with a full glass of water on an empty stomach.   amLODipine 5 MG tablet Commonly known as:  NORVASC Take 1 tablet (5 mg total) by mouth daily. What changed:  medication strength  how much to take   aspirin EC 81 MG tablet Take 81 mg by mouth 2 (two) times daily.   bisacodyl 10 MG suppository Commonly known as:  DULCOLAX Place 10 mg rectally as needed for moderate constipation.   calcium-vitamin D 500-200 MG-UNIT tablet Commonly known as:  OSCAL WITH D Take 1 tablet by mouth 2 (two) times daily.   carvedilol 6.25 MG tablet Commonly known as:  COREG Take 6.25 mg by mouth 2 (two) times daily with a meal.   cyclobenzaprine 5 MG tablet Commonly known as:  FLEXERIL Take 5 mg by mouth 3 (three) times daily as needed for muscle spasms.   DERMACLOUD Crea Apply topically.   ergocalciferol 50000 units capsule Commonly known as:  VITAMIN D2 Take 50,000 Units by mouth once a week.   fentaNYL 25 MCG/HR patch Commonly known as:  DURAGESIC - dosed mcg/hr Place 1 patch (25 mcg total) onto the skin every 3 (three) days.   levothyroxine 88 MCG tablet Commonly known as:  SYNTHROID, LEVOTHROID Take 88 mcg by mouth daily before breakfast.   magnesium hydroxide 400 MG/5ML suspension Commonly known as:  MILK OF MAGNESIA Take by mouth daily as needed for mild constipation.   Melatonin 3 MG Tabs Take 6 mg by mouth at  bedtime.   metaxalone 800 MG tablet Commonly known as:  SKELAXIN Take 1 tablet (800 mg total) by mouth 3 (three) times daily.   nystatin powder Generic drug:  nystatin Apply topically 2 (two) times daily.   pravastatin 40 MG tablet Commonly known as:  PRAVACHOL Take 40 mg by mouth daily.   senna 8.6 MG tablet Commonly known as:  SENOKOT Take 2 tablets by mouth 2 (two) times daily.   traMADol 50 MG tablet Commonly known as:  ULTRAM Take 1 tablet (50 mg total) by mouth every 6 (six) hours as needed for moderate pain.   traZODone 50 MG tablet Commonly known as:  DESYREL Take 25 mg by mouth at bedtime.            Discharge Care Instructions        Start     Ordered   06/25/17 0000  fentaNYL (DURAGESIC - DOSED MCG/HR) 25 MCG/HR patch  every 72 hours     06/23/17 1134   06/24/17 0000  amLODipine (NORVASC) 5 MG tablet  Daily     06/23/17 1134   06/24/17 0000  alendronate (FOSAMAX) 70 MG tablet  Weekly     06/24/17 0954   06/23/17 0000  metaxalone (SKELAXIN)  800 MG tablet  3 times daily     06/23/17 1134   06/23/17 0000  traMADol (ULTRAM) 50 MG tablet  Every 6 hours PRN     06/23/17 1134        Diet and Activity recommendation: See Discharge Instructions above   Consults obtained -orthopedic,palliative care,PT,[place]   Major procedures and Radiology Reports - PLEASE review detailed and final reports for all details, in brief -      Dg Chest 1 View  Result Date: 06/18/2017 CLINICAL DATA:  Status post fall.  Back pain. EXAM: CHEST 1 VIEW COMPARISON:  None. FINDINGS: The heart size and mediastinal contours are within normal limits. Prior TAVR. Both lungs are clear. The visualized skeletal structures are unremarkable. IMPRESSION: No active disease. Electronically Signed   By: Elige Ko   On: 06/18/2017 14:56   Dg Lumbar Spine 2-3 Views  Result Date: 06/18/2017 CLINICAL DATA:  Fall. EXAM: LUMBAR SPINE - 2-3 VIEW COMPARISON:  No prior. FINDINGS: Surgical clips  right upper quadrant . Coarse calcifications in the left upper quadrant possibly in the adrenal. Diffuse osteopenia. Lumbar spine scoliosis concave right. Diffuse degenerative change. No acute bony abnormality identified.Tiny sclerotic focus in the left sacrum possibly a bone island. Aortoiliac atherosclerotic vascular calcification IMPRESSION: 1. Mild scoliosis concave right. Diffuse osteopenia degenerative change. No acute bony abnormality identified. 2. Aortoiliac atherosclerotic vascular disease. Electronically Signed   By: Maisie Fus  Register   On: 06/18/2017 16:55   Ct Head Wo Contrast  Result Date: 06/18/2017 CLINICAL DATA:  Getting out of bit head. Fall. Possible loss of consciousness. History of dementia. Soft tissue hematoma. EXAM: CT HEAD WITHOUT CONTRAST CT CERVICAL SPINE WITHOUT CONTRAST TECHNIQUE: Multidetector CT imaging of the head and cervical spine was performed following the standard protocol without intravenous contrast. Multiplanar CT image reconstructions of the cervical spine were also generated. COMPARISON:  None. FINDINGS: CT HEAD FINDINGS Brain: Generalized atrophy. Chronic small-vessel ischemic changes of the cerebral hemispheric white matter. No evidence of acute or subacute infarction, mass lesion, hemorrhage, hydrocephalus or extra-axial collection. Vascular: There is atherosclerotic calcification of the major vessels at the base of the brain. Skull: No skull fracture. Sinuses/Orbits: Clear/normal Other: Right frontal scalp hematoma. CT CERVICAL SPINE FINDINGS Alignment: Normal Skull base and vertebrae: No acute or traumatic finding. Soft tissues and spinal canal: No significant soft tissue finding. Ordinary carotid artery atherosclerosis. Disc levels: Mild spondylosis C3-4 and C5-6. Mild facet osteoarthritis right C2-3 and C3-4 and left C3-4 and C4-5. Upper chest: Benign appearing pleural and parenchymal apical scarring. Other: None IMPRESSION: Head CT: No acute or traumatic  intracranial finding. Atrophy and chronic small vessel disease. No skull fracture. Right frontal scalp hematoma. Cervical spine CT: No acute or traumatic finding. Mild degenerative changes for age. Electronically Signed   By: Paulina Fusi M.D.   On: 06/18/2017 14:05   Ct Cervical Spine Wo Contrast  Result Date: 06/18/2017 CLINICAL DATA:  Getting out of bit head. Fall. Possible loss of consciousness. History of dementia. Soft tissue hematoma. EXAM: CT HEAD WITHOUT CONTRAST CT CERVICAL SPINE WITHOUT CONTRAST TECHNIQUE: Multidetector CT imaging of the head and cervical spine was performed following the standard protocol without intravenous contrast. Multiplanar CT image reconstructions of the cervical spine were also generated. COMPARISON:  None. FINDINGS: CT HEAD FINDINGS Brain: Generalized atrophy. Chronic small-vessel ischemic changes of the cerebral hemispheric white matter. No evidence of acute or subacute infarction, mass lesion, hemorrhage, hydrocephalus or extra-axial collection. Vascular: There is atherosclerotic calcification of the major  vessels at the base of the brain. Skull: No skull fracture. Sinuses/Orbits: Clear/normal Other: Right frontal scalp hematoma. CT CERVICAL SPINE FINDINGS Alignment: Normal Skull base and vertebrae: No acute or traumatic finding. Soft tissues and spinal canal: No significant soft tissue finding. Ordinary carotid artery atherosclerosis. Disc levels: Mild spondylosis C3-4 and C5-6. Mild facet osteoarthritis right C2-3 and C3-4 and left C3-4 and C4-5. Upper chest: Benign appearing pleural and parenchymal apical scarring. Other: None IMPRESSION: Head CT: No acute or traumatic intracranial finding. Atrophy and chronic small vessel disease. No skull fracture. Right frontal scalp hematoma. Cervical spine CT: No acute or traumatic finding. Mild degenerative changes for age. Electronically Signed   By: Paulina Fusi M.D.   On: 06/18/2017 14:05   Ct Thoracic Spine Wo  Contrast  Result Date: 06/19/2017 CLINICAL DATA:  Thoracic spine pain since a fall out of bed 3 days ago. Initial encounter. EXAM: CT THORACIC SPINE WITHOUT CONTRAST TECHNIQUE: Multidetector CT images of the thoracic were obtained using the standard protocol without intravenous contrast. COMPARISON:  None. FINDINGS: Alignment: Maintained.  Exaggeration of kyphosis noted. Vertebrae: The patient has a compression fracture of T4 with vertebral body height loss of approximately 50%. The fracture is age indeterminate. No bony retropulsion or involvement of the posterior elements is identified. No other fracture is seen. Bones appear osteopenic. Calcific aortic and coronary atherosclerosis is noted. Trace amount of pleural fluid is present on the right. Paraspinal and other soft tissues: Two remote lower left rib fractures are identified. Calcific aortic and coronary atherosclerosis is seen. Cardiomegaly. There is a tiny right pleural effusion. The patient is status post aortic valve repair. Disc levels: No bulge or protrusion is seen. The central canal and foramina appear open. IMPRESSION: T4 compression fracture with vertebral body height loss of approximately 50% is age indeterminate. There is no bony retropulsion. No other fracture is identified. Osteopenia. The central canal and foramina appear open at all levels. Calcific aortic and coronary atherosclerosis. Cardiomegaly. Small right pleural effusion. Electronically Signed   By: Drusilla Kanner M.D.   On: 06/19/2017 13:12   Ct Lumbar Spine Wo Contrast  Result Date: 06/19/2017 CLINICAL DATA:  Back pain since a fall 3 days ago. EXAM: CT LUMBAR SPINE WITHOUT CONTRAST TECHNIQUE: Multidetector CT imaging of the lumbar spine was performed without intravenous contrast administration. Multiplanar CT image reconstructions were also generated. COMPARISON:  Radiographs dated 06/18/2017 FINDINGS: Segmentation: 5 lumbar type vertebrae. Alignment: Normal. Vertebrae: Slight  irregularity of the superior endplate of L5 which could represent an acute Schmorl's node. No disruption of the anterior or posterior aspects of the superior endplate of L5. Paraspinal and other soft tissues: Aortic atherosclerosis. Disc levels: T11-12 and T12-L1:  Normal. L1-2:  Normal. L2-3:  Tiny broad-based disc bulge with no neural impingement. L3-4: Small broad-based soft disc protrusion. Congenitally short pedicles with hypertrophy of the ligamentum flavum facet joints all combine to create severe spinal stenosis. The thecal sac is almost obliterated. L4-5: Minimal broad-based disc bulge. However, congenitally short pedicles combined with hypertrophy of the ligamentum flavum and facet joints combine to create severe spinal stenosis. Possible acute small Schmorl's node in the superior endplate of L5 (central focal endplate fracture). L5-S1: Small broad-based disc bulge. Severe left facet arthritis. No focal neural impingement. IMPRESSION: 1. Possible focal central superior endplate fracture of L5 (acute Schmorl's node). 2. Severe spinal stenosis at L3-4. 3. Severe spinal stenosis at L4-5. 4. Aortic atherosclerosis. Electronically Signed   By: Francene Boyers M.D.   On:  06/19/2017 13:12   US Carotid Bilateral  Result Date: 06/19/2017 CLINICAL DATA:  Syncopal episode. History of hypertension and hyperlipidemia. History of heart valve replacement. Former smoker. EXAM: BILATERAL CAROTID DUPLEX ULTRASOUND TECHNIQUE: Wallace Cullens scale imaging, color Doppler and duplex ultrasound were performed of bilateral carotid and vertebral arteries in the neck. COMPARISON:  Chest radiograph - 06/18/2017 FINDINGS: Criteria: Quantification of carotid stenosis is based on velocity parameters that correlate the residual internal carotid diameter with NASCET-based stenosis levels, using the diameter of the distal internal carotid lumen as the denominator for stenosis measurement. The following velocity measurements were obtained: RIGHT  ICA:  62/16 cm/sec CCA:  51/5 cm/sec SYSTOLIC ICA/CCA RATIO:  1.2 DIASTOLIC ICA/CCA RATIO:  2.9 ECA:  60 cm/sec LEFT ICA:  77/20 cm/sec CCA:  52/9 cm/sec SYSTOLIC ICA/CCA RATIO:  1.5 DIASTOLIC ICA/CCA RATIO:  2.1 ECA:  41 cm/sec RIGHT CAROTID ARTERY: There is a minimal amount of mixed echogenic plaque within the right carotid bulb (image 16), extending to involve the origin and proximal aspects of the right internal carotid artery (image 23), not resulting in elevated peak systolic velocities within the interrogated course of the right internal carotid artery to suggest a hemodynamically significant stenosis. RIGHT VERTEBRAL ARTERY:  Antegrade flow LEFT CAROTID ARTERY: There is a minimal amount of mixed echogenic plaque involving the origin and proximal aspects of the left internal carotid artery (image 55), not resulting in elevated peak systolic velocities within the interrogated course of the left internal carotid artery to suggest a hemodynamically significant stenosis. LEFT VERTEBRAL ARTERY:  Antegrade flow Note is made of an apparent cardiac arrhythmia (representative image 28). IMPRESSION: 1. Minimal amount of bilateral atherosclerotic plaque, right greater than left, not resulting in a hemodynamically significant stenosis within either internal carotid artery. 2. Apparent cardiac arrhythmia. Further evaluation with ECG monitoring could be performed as indicated. Electronically Signed   By: Simonne Come M.D.   On: 06/19/2017 13:08    Micro Results     Recent Results (from the past 240 hour(s))  MRSA PCR Screening     Status: None   Collection Time: 06/19/17  3:15 AM  Result Value Ref Range Status   MRSA by PCR NEGATIVE NEGATIVE Final    Comment:        The GeneXpert MRSA Assay (FDA approved for NASAL specimens only), is one component of a comprehensive MRSA colonization surveillance program. It is not intended to diagnose MRSA infection nor to guide or monitor treatment for MRSA  infections.        Today   Subjective:   Jimmye Norman  Has Less back pain. Stable for discharge today. Objective:   Blood pressure (!) 170/89, pulse 91, temperature 98.3 F (36.8 C), temperature source Oral, resp. rate 18, height 5\' 4"  (1.626 m), weight 57.1 kg (125 lb 12.8 oz), SpO2 97 %.   Intake/Output Summary (Last 24 hours) at 06/24/17 0955 Last data filed at 06/24/17 0937  Gross per 24 hour  Intake              600 ml  Output             1200 ml  Net             -600 ml    Exam Awake Alert, Oriented x 3, No new F.N deficits, Normal affect [place].AT,PERRAL Supple Neck,No JVD, No cervical lymphadenopathy appriciated.  Symmetrical Chest wall movement, Good air movement bilaterally, CTAB RRR,No Gallops,Rubs or new Murmurs, No Parasternal Heave +ve B.Sounds,  Abd Soft, Non tender, No organomegaly appriciated, No rebound -guarding or rigidity. No Cyanosis, Clubbing or edema, No new Rash or bruise  Data Review   CBC w Diff:  Lab Results  Component Value Date   WBC 12.2 (H) 06/20/2017   HGB 12.6 06/20/2017   HCT 36.6 06/20/2017   PLT 233 06/20/2017   LYMPHOPCT 4 06/18/2017   MONOPCT 5 06/18/2017   EOSPCT 0 06/18/2017   BASOPCT 1 06/18/2017    CMP:  Lab Results  Component Value Date   NA 134 (L) 06/20/2017   K 4.2 06/20/2017   CL 102 06/20/2017   CO2 24 06/20/2017   BUN 12 06/20/2017   CREATININE 0.60 06/20/2017   PROT 7.8 06/18/2017   ALBUMIN 4.5 06/18/2017   BILITOT 2.0 (H) 06/18/2017   ALKPHOS 63 06/18/2017   AST 34 06/18/2017   ALT 13 (L) 06/18/2017  .   Total Time in preparing paper work, data evaluation and todays exam - 35 minutes  [place], Gerlene Burdock M.D on 06/24/2017 at 9:55 AM    Note: This dictation was prepared with Dragon dictation along with smaller phrase technology. Any transcriptional errors that result from this process are unintentional.

## 2017-06-24 LAB — GLUCOSE, CAPILLARY
GLUCOSE-CAPILLARY: 135 mg/dL — AB (ref 65–99)
Glucose-Capillary: 111 mg/dL — ABNORMAL HIGH (ref 65–99)

## 2017-06-24 MED ORDER — ALENDRONATE SODIUM 70 MG PO TABS: 70.0000 mg | ORAL_TABLET | ORAL | 0 refills | Status: DC

## 2017-06-24 MED ORDER — BISACODYL 10 MG RE SUPP
10.0000 mg | Freq: Every day | RECTAL | Status: DC | PRN
  Administered 2017-06-24: 10 mg via RECTAL
  Filled 2017-06-24: qty 1

## 2017-06-24 MED ORDER — FLEET ENEMA 7-19 GM/118ML RE ENEM
1.0000 | ENEMA | Freq: Once | RECTAL | Status: AC
  Administered 2017-06-24: 1 via RECTAL

## 2017-06-24 NOTE — Clinical Social Work Note (Signed)
CSW presented bed offers to patient's son [name] and he has agreed to have patient go to [facility].  CSW spoke to [facility] and they can accept patient today.  Ervin Knack. Anees Vanecek, MSW, [facility] Majors [phone]  06/24/2017 9:46 AM

## 2017-06-24 NOTE — Discharge Instructions (Signed)

## 2017-06-24 NOTE — Care Management Important Message (Signed)
Important Message  Patient Details  Name: Sheri Webster MRN: [id number] Date of Birth: [birth year]   Medicare Important Message Given:  Yes Signed IM notice given    Eber Hong, RN 06/24/2017, 11:19 AM

## 2017-06-24 NOTE — Clinical Social Work Note (Signed)
Patient to be d/c'ed today to [facility].  Patient and family agreeable to plans will transport via ems RN to call report [phone] room [phone].  Windell Moulding, MSW, Theresia Majors [phone]

## 2017-06-24 NOTE — Progress Notes (Signed)
Called report to facility nurse at this time. All questions answered. Also called EMS for non-emergent patient transfer to facility. Will also call to inform patient's son of transfer. Jari Favre [facility]

## 2017-06-24 NOTE — Clinical Social Work Placement (Signed)
   CLINICAL SOCIAL WORK PLACEMENT  NOTE  Date:  06/24/2017  Patient Details  Name: Sheri Webster MRN: [id number] Date of Birth: 01-20-1921  Clinical Social Work is seeking post-discharge placement for this patient at the Skilled  Nursing Facility level of care (*CSW will initial, date and re-position this form in  chart as items are completed):  Yes   Patient/family provided with [facility] Clinical Social Work Department's list of facilities offering this level of care within the geographic area requested by the patient (or if unable, by the patient's family).  Yes   Patient/family informed of their freedom to choose among providers that offer the needed level of care, that participate in Medicare, Medicaid or managed care program needed by the patient, have an available bed and are willing to accept the patient.  Yes   Patient/family informed of [facility]'s ownership interest in [facility] and [facility], as well as of the fact that they are under no obligation to receive care at these facilities.  PASRR submitted to EDS on 06/23/17     PASRR number received on 06/23/17     Existing PASRR number confirmed on       FL2 transmitted to all facilities in geographic area requested by pt/family on 06/23/17     FL2 transmitted to all facilities within larger geographic area on       Patient informed that his/her managed care company has contracts with or will negotiate with certain facilities, including the following:        Yes   Patient/family informed of bed offers received.  Patient chooses bed at [facility]     Physician recommends and patient chooses bed at      Patient to be transferred to [facility] on 06/24/17.  Patient to be transferred to facility by [facility]     Patient family notified on 06/24/17 of transfer.  Name of family member notified:  [name]     PHYSICIAN Please sign FL2, Please sign DNR      Additional Comment:    _______________________________________________ Darleene Cleaver, LCSWA 06/24/2017, 10:30 AM

## 2017-06-24 NOTE — Progress Notes (Signed)
Patient ID: Arla Boutwell, female   DOB: 07-26-1921, 83 y.o.   MRN: [id number]  [facility] PROGRESS NOTE  Alverta Caccamo WUJ:[id number] DOB: 1921/05/11 DOA: 06/18/2017 PCP: Patient, No Pcp Per  HPI/Subjective: Notified that the passar number was received.  Patient complains of constipation. Patient having some back pain but it's better than when she came in.  Objective: Vitals:   06/23/17 2125 06/24/17 0654  BP: (!) 151/79 (!) 170/89  Pulse: 86 91  Resp: 18 18  Temp: 98.2 F (36.8 C) 98.3 F (36.8 C)  SpO2: 92% 97%    Filed Weights   06/23/17 0516 06/24/17 0500 06/24/17 0654  Weight: 57.2 kg (126 lb) 57.1 kg (125 lb 12.8 oz) 57.1 kg (125 lb 12.8 oz)    ROS: Review of Systems  Constitutional: Negative for chills and fever.  Eyes: Negative for blurred vision.  Respiratory: Negative for cough and shortness of breath.   Cardiovascular: Negative for chest pain.  Gastrointestinal: Positive for constipation. Negative for abdominal pain, diarrhea, nausea and vomiting.  Genitourinary: Negative for dysuria.  Musculoskeletal: Positive for back pain. Negative for joint pain.  Neurological: Negative for dizziness and headaches.   Exam: Physical Exam  HENT:  Nose: No mucosal edema.  Mouth/Throat: No oropharyngeal exudate or posterior oropharyngeal edema.  Eyes: Pupils are equal, round, and reactive to light. Conjunctivae, EOM and lids are normal.  Neck: No JVD present. Carotid bruit is not present. No edema present. No thyroid mass and no thyromegaly present.  Cardiovascular: S1 normal and S2 normal.  Exam reveals no gallop.   No murmur heard. Pulses:      Dorsalis pedis pulses are 2+ on the right side, and 2+ on the left side.  Respiratory: No respiratory distress. She has no wheezes. She has no rhonchi. She has no rales.  GI: Soft. Bowel sounds are normal. There is no tenderness.  Musculoskeletal:       Right shoulder: She exhibits no swelling.  Lymphadenopathy:    She has  no cervical adenopathy.  Neurological: She is alert. No cranial nerve deficit.  Able to straight leg raise bilaterally  Skin: Skin is warm. No rash noted. Nails show no clubbing.  Hematoma right forehead  Psychiatric: She has a normal mood and affect.      Data Reviewed: Basic Metabolic Panel:  Recent Labs Lab 06/18/17 1427 06/19/17 0221 06/19/17 0813 06/20/17 0422  NA 129* 132*  --  134*  K 4.1 2.9*  --  4.2  CL 91* 96*  --  102  CO2 27 28  --  24  GLUCOSE 119* 117*  --  115*  BUN 14 14  --  12  CREATININE 0.69 0.72  --  0.60  CALCIUM 9.5 8.4*  --  8.5*  MG  --   --  2.0  --    Liver Function Tests:  Recent Labs Lab 06/18/17 1427  AST 34  ALT 13*  ALKPHOS 63  BILITOT 2.0*  PROT 7.8  ALBUMIN 4.5   CBC:  Recent Labs Lab 06/18/17 1427 06/19/17 0221 06/20/17 0422  WBC 15.8* 10.5 12.2*  NEUTROABS 14.3*  --   --   HGB 14.4 12.2 12.6  HCT 40.9 34.8* 36.6  MCV 95.6 98.2 99.2  PLT 305 219 233   Cardiac Enzymes:  Recent Labs Lab 06/19/17 0221 06/19/17 0813 06/19/17 1051 06/19/17 1438 06/19/17 1831  TROPONINI <0.03 0.05* <0.03 0.11* 0.03*    CBG:  Recent Labs Lab 06/22/17 0744 06/22/17 1207 06/22/17  1710 06/23/17 0840 06/24/17 0736  GLUCAP 115* 140* 118* 125* 111*    Recent Results (from the past 240 hour(s))  MRSA PCR Screening     Status: None   Collection Time: 06/19/17  3:15 AM  Result Value Ref Range Status   MRSA by PCR NEGATIVE NEGATIVE Final    Comment:        The GeneXpert MRSA Assay (FDA approved for NASAL specimens only), is one component of a comprehensive MRSA colonization surveillance program. It is not intended to diagnose MRSA infection nor to guide or monitor treatment for MRSA infections.      Studies: No results found.  Scheduled Meds: . amLODipine  5 mg Oral Daily  . aspirin EC  81 mg Oral BID  . docusate sodium  100 mg Oral BID  . enoxaparin (LOVENOX) injection  40 mg Subcutaneous Q24H  . fentaNYL  25  mcg Transdermal Q72H  . levothyroxine  88 mcg Oral QAC breakfast  . metaxalone  800 mg Oral TID  . pravastatin  40 mg Oral QPC supper  . sodium phosphate  1 enema Rectal Once  . traZODone  25 mg Oral QHS    Assessment/Plan:  1. Compression fracture. Pain control with Duragesic patch and Toradol. Fosamax and calcium and vitamin D 2. essential hypertension on amlodipine 3. Hypothyroidism unspecified on levothyroxine 4. Hyperlipidemia unspecified on pravastatin 5. Hyponatremia improved with IV fluid hydration 6. Hypokalemia improved 7. Constipation. We'll give Fleet enema or Dulcolax suppository prior to disposition  Code Status:     Code Status Orders        Start     Ordered   06/19/17 1044  Do not attempt resuscitation (DNR)  Continuous    Question Answer Comment  In the event of cardiac or respiratory ARREST Do not call a "code blue"   In the event of cardiac or respiratory ARREST Do not perform Intubation, CPR, defibrillation or ACLS   In the event of cardiac or respiratory ARREST Use medication by any route, position, wound care, and other measures to relive pain and suffering. May use oxygen, suction and manual treatment of airway obstruction as needed for comfort.      06/19/17 1043    Code Status History    Date Active Date Inactive Code Status Order ID Comments User Context   06/18/2017  3:47 PM 06/19/2017 10:43 AM Full Code [id number]  Katha Hamming, MD ED     Family Communication: Son at the bedside  Disposition Plan: out to rehabilitation today  Time spent: 31 minutes  Alford Highland  [employer]

## 2017-07-20 NOTE — Progress Notes (Signed)
07/21/2017 9:15 AM   Sheri Webster 01/10/1921 [id number]  Referring provider: No referring provider defined for this encounter.  CC: Urinary retention  HPI: 83 yo WF with a history of urinary retention who underwent a TOV one month ago.  Background history This is a 83 year old female who recently moved to [place]. She underwent foot surgery April 2018 and was evidently in retention postop. The son is not sure if she's had voiding trials and we don't have any of the records. She is tolerating the Foley well. She has no prior GU history or surgery. NG risk includes neuropathy in her legs and she uses a walker.   She was here with her son, Sheri Webster.   Urine culture from her visit on 06/12/2017 grew out greater than 3 organisms.  She is having nocturia and urinary tract infection as her complaint today.  Today, she is experiencing urgency x 0-3, frequency x 4-7, not restricting fluids to avoid visits to the restroom, is engaging in toilet mapping, incontinence x 0 and nocturia x 0-3.   Her PVR is 36 mL.   She denies dysuria, gross hematuria and suprapubic pain. She also does not have a recent complaint of fever, chills, nausea or vomiting.  PMH: Past Medical History:  Diagnosis Date  . Arthritis   . Hyperlipidemia   . Hypertension     Surgical History: Past Surgical History:  Procedure Laterality Date  . ABDOMINAL HYSTERECTOMY    . CARDIAC SURGERY  2004   heart valve replacement    Home Medications:  Allergies as of 07/21/2017      Reactions   Keflex [cephalexin]       Medication List       Accurate as of 07/21/17  9:15 AM. Always use your most recent med list.          acetaminophen 500 MG tablet Commonly known as:  TYLENOL Take 500 mg by mouth every 6 (six) hours as needed.   alendronate 70 MG tablet Commonly known as:  FOSAMAX Take 1 tablet (70 mg total) by mouth once a week. Take with a full glass of water on an empty stomach.   amLODipine 5 MG  tablet Commonly known as:  NORVASC Take 1 tablet (5 mg total) by mouth daily.   aspirin EC 81 MG tablet Take 81 mg by mouth 2 (two) times daily.   bisacodyl 10 MG suppository Commonly known as:  DULCOLAX Place 10 mg rectally as needed for moderate constipation.   calcium-vitamin D 500-200 MG-UNIT tablet Commonly known as:  OSCAL WITH D Take 1 tablet by mouth 2 (two) times daily.   carvedilol 6.25 MG tablet Commonly known as:  COREG Take 6.25 mg by mouth 2 (two) times daily with a meal.   cyclobenzaprine 5 MG tablet Commonly known as:  FLEXERIL Take 5 mg by mouth 3 (three) times daily as needed for muscle spasms.   DERMACLOUD Crea Apply topically.   ergocalciferol 50000 units capsule Commonly known as:  VITAMIN D2 Take 50,000 Units by mouth once a week.   fentaNYL 25 MCG/HR patch Commonly known as:  DURAGESIC - dosed mcg/hr Place 1 patch (25 mcg total) onto the skin every 3 (three) days.   levothyroxine 88 MCG tablet Commonly known as:  SYNTHROID, LEVOTHROID Take 88 mcg by mouth daily before breakfast.   magnesium hydroxide 400 MG/5ML suspension Commonly known as:  MILK OF MAGNESIA Take by mouth daily as needed for mild constipation.   Melatonin 3 MG Tabs  Take 6 mg by mouth at bedtime.   metaxalone 800 MG tablet Commonly known as:  SKELAXIN Take 1 tablet (800 mg total) by mouth 3 (three) times daily.   nystatin powder Generic drug:  nystatin Apply topically 2 (two) times daily.   pravastatin 40 MG tablet Commonly known as:  PRAVACHOL Take 40 mg by mouth daily.   senna 8.6 MG tablet Commonly known as:  SENOKOT Take 2 tablets by mouth 2 (two) times daily.   traMADol 50 MG tablet Commonly known as:  ULTRAM Take 1 tablet (50 mg total) by mouth every 6 (six) hours as needed for moderate pain.   traZODone 50 MG tablet Commonly known as:  DESYREL Take 25 mg by mouth at bedtime.            Discharge Care Instructions        Start     Ordered    07/21/17 0000  BLADDER SCAN AMB NON-IMAGING     07/21/17 0837      Allergies:  Allergies  Allergen Reactions  . Keflex [Cephalexin]     Family History: Family History  Problem Relation Age of Onset  . Bladder Cancer Neg Hx   . Kidney cancer Neg Hx     Social History:  reports that she quit smoking about 21 years ago. She has never used smokeless tobacco. She reports that she does not drink alcohol or use drugs.  ROS: UROLOGY Frequent Urination?: No Hard to postpone urination?: No Burning/pain with urination?: No Get up at night to urinate?: Yes Leakage of urine?: No Urine stream starts and stops?: No Trouble starting stream?: No Do you have to strain to urinate?: No Blood in urine?: No Urinary tract infection?: No Sexually transmitted disease?: No Injury to kidneys or bladder?: No Painful intercourse?: No Weak stream?: No Currently pregnant?: No Vaginal bleeding?: No Last menstrual period?: n  Gastrointestinal Nausea?: No Vomiting?: No Indigestion/heartburn?: No Diarrhea?: No Constipation?: No  Constitutional Fever: No Night sweats?: No Weight loss?: No Fatigue?: No  Skin Skin rash/lesions?: No Itching?: No  Eyes Blurred vision?: No Double vision?: No  Ears/Nose/Throat Sore throat?: No Sinus problems?: No  Hematologic/Lymphatic Swollen glands?: No Easy bruising?: No  Cardiovascular Leg swelling?: No Chest pain?: No  Respiratory Cough?: No Shortness of breath?: No  Endocrine Excessive thirst?: No  Musculoskeletal Back pain?: No Joint pain?: No  Neurological Headaches?: No Dizziness?: No  Psychologic Depression?: No Anxiety?: No  Physical Exam: BP 130/72   Pulse 91   Ht  (1.6 m)   Wt 119 lb (54 kg)   BMI 21.08 kg/m   Constitutional:  Alert and oriented, No acute distress. HEENT: [place] AT, moist mucus membranes.  Trachea midline, no masses. Cardiovascular: No clubbing, cyanosis, or edema. Respiratory: Normal  respiratory effort, no increased work of breathing. GI: Abdomen is soft, nontender, nondistended, no abdominal masses GU: No CVA tenderness. Skin: No rashes, bruises or suspicious lesions. Neurologic: Grossly intact, no focal deficits, moving all 4 extremities. Psychiatric: Normal mood and affect.  Laboratory Data: Cr 0.60 in 06/2017   Assessment & Plan:    1. History of urinary retention:     - PVR is minimal today  - RTC in one year for overactive bladder questionnaire and PVR  2. Frequency  - Very hesitant to start an overactive bladder agent at this time as she has a history of urinary retention and that is a side effect of these medications  - Offered PTNS, the patient felt  that transportation would be an issue and did not want to pursue this treatment  - Patient does have a history of constipation and I have recommended to increase her MOM so that she can have a bowel movement daily  - Also have given her a list of dietary irritants for the bladder and advised her to avoid caffeine, sugar and artificial sweeteners  Return in about 1 year (around 07/21/2018) for PVR and OAB questionnaire.  Michiel Cowboy, PA-C  [facility] [address] [place], [place] [address] [phone]

## 2017-07-21 ENCOUNTER — Ambulatory Visit (INDEPENDENT_AMBULATORY_CARE_PROVIDER_SITE_OTHER): Payer: Medicare Other | Admitting: Urology

## 2017-07-21 ENCOUNTER — Encounter: Payer: Self-pay | Admitting: Urology

## 2017-07-21 VITALS — BP 130/72 | HR 91 | Ht 63.0 in | Wt 119.0 lb

## 2017-07-21 DIAGNOSIS — Z87898 Personal history of other specified conditions: Secondary | ICD-10-CM | POA: Diagnosis not present

## 2017-07-21 DIAGNOSIS — R350 Frequency of micturition: Secondary | ICD-10-CM

## 2017-07-21 LAB — BLADDER SCAN AMB NON-IMAGING: Scan Result: 36

## 2017-08-15 ENCOUNTER — Emergency Department: Payer: Medicare Other

## 2017-08-15 ENCOUNTER — Encounter: Payer: Self-pay | Admitting: Emergency Medicine

## 2017-08-15 ENCOUNTER — Emergency Department
Admission: EM | Admit: 2017-08-15 | Discharge: 2017-08-15 | Disposition: A | Discharge status: Home / Self Care | Payer: Medicare Other | Attending: Emergency Medicine | Admitting: Emergency Medicine

## 2017-08-15 DIAGNOSIS — Z952 Presence of prosthetic heart valve: Secondary | ICD-10-CM | POA: Diagnosis not present

## 2017-08-15 DIAGNOSIS — I10 Essential (primary) hypertension: Secondary | ICD-10-CM | POA: Insufficient documentation

## 2017-08-15 DIAGNOSIS — M25551 Pain in right hip: Secondary | ICD-10-CM | POA: Insufficient documentation

## 2017-08-15 DIAGNOSIS — Z7982 Long term (current) use of aspirin: Secondary | ICD-10-CM | POA: Insufficient documentation

## 2017-08-15 DIAGNOSIS — W010XXA Fall on same level from slipping, tripping and stumbling without subsequent striking against object, initial encounter: Secondary | ICD-10-CM | POA: Insufficient documentation

## 2017-08-15 DIAGNOSIS — Z87891 Personal history of nicotine dependence: Secondary | ICD-10-CM | POA: Insufficient documentation

## 2017-08-15 DIAGNOSIS — Z7902 Long term (current) use of antithrombotics/antiplatelets: Secondary | ICD-10-CM | POA: Diagnosis not present

## 2017-08-15 DIAGNOSIS — Z79899 Other long term (current) drug therapy: Secondary | ICD-10-CM | POA: Insufficient documentation

## 2017-08-15 DIAGNOSIS — Z9181 History of falling: Secondary | ICD-10-CM | POA: Diagnosis not present

## 2017-08-15 DIAGNOSIS — W19XXXA Unspecified fall, initial encounter: Secondary | ICD-10-CM

## 2017-08-15 NOTE — ED Triage Notes (Signed)
Pt to ed via ems from [facility].  Pt had unwitnessed fall at the facility.  Pt denied pain initially and was ambulatory with walker. Pt reports now mild pain in right hip.  Pt alert and oriented.  Pt denies hitting head.  Denies dizziness.  Pt states she was standing up at a drawer and lost balance and fell.  Pt reports hx of falling.

## 2017-08-15 NOTE — ED Notes (Signed)
This RN spoke with pts son Sarita Hakanson) and he said he would be here in 18min-1hr to pick up his mother.

## 2017-08-15 NOTE — ED Notes (Signed)
Pt cannot sign discharge due to dementia.

## 2017-08-15 NOTE — ED Provider Notes (Signed)
[facility] Emergency Department Provider Note  ____________________________________________   I have reviewed the triage vital signs and the nursing notes.   HISTORY  Chief Complaint Fall    HPI Sheri Webster is a 83 y.o. female  who presents today after which she describes as a non-syncopal fall she was sitting over to get something out of a drawer and she tripped. She does not think she hit her head. She had a little pain in her hip which is been able to walk. No other injury no other complaints feels at her baseline, does have a history of falls is on aspirin    Past Medical History:  Diagnosis Date  . Arthritis   . Hyperlipidemia   . Hypertension     Patient Active Problem List   Diagnosis Date Noted  . Fall   . Dehydration   . Acute midline thoracic back pain   . Palliative care by specialist   . Goals of care, counseling/discussion   . Syncope 06/18/2017    Past Surgical History:  Procedure Laterality Date  . ABDOMINAL HYSTERECTOMY    . CARDIAC SURGERY  2004   heart valve replacement    Prior to Admission medications   Medication Sig Start Date End Date Taking? Authorizing Provider  acetaminophen (TYLENOL) 500 MG tablet Take 500 mg by mouth every 6 (six) hours as needed.   Yes [provider]  alendronate (FOSAMAX) 70 MG tablet Take 1 tablet (70 mg total) by mouth once a week. Take with a full glass of water on an empty stomach. 06/24/17  Yes Wieting, Richard, MD  amLODipine (NORVASC) 5 MG tablet Take 1 tablet (5 mg total) by mouth daily. Patient taking differently: Take 5 mg by mouth 2 (two) times daily.  06/24/17  Yes Katha Hamming, MD  aspirin EC 81 MG tablet Take 81 mg by mouth 2 (two) times daily.    Yes [provider]  bisacodyl (DULCOLAX) 10 MG suppository Place 10 mg rectally as needed for moderate constipation.   Yes [provider]  calcium-vitamin D (OSCAL WITH D) 500-200 MG-UNIT tablet Take 1  tablet by mouth 2 (two) times daily.    Yes [provider]  carvedilol (COREG) 6.25 MG tablet Take 6.25 mg by mouth 2 (two) times daily with a meal.   Yes [provider]  cyclobenzaprine (FLEXERIL) 5 MG tablet Take 5 mg by mouth 3 (three) times daily as needed for muscle spasms.   Yes [provider]  ergocalciferol (VITAMIN D2) 50000 units capsule Take 50,000 Units by mouth once a week.   Yes [provider]  levothyroxine (SYNTHROID, LEVOTHROID) 88 MCG tablet Take 88 mcg by mouth daily before breakfast.   Yes [provider]  Melatonin 3 MG TABS Take 6 mg by mouth at bedtime.    Yes [provider]  nystatin (NYSTATIN) powder Apply topically 2 (two) times daily.    Yes [provider]  pravastatin (PRAVACHOL) 40 MG tablet Take 40 mg by mouth daily.   Yes [provider]  senna (SENOKOT) 8.6 MG tablet Take 2 tablets by mouth 2 (two) times daily.    Yes [provider]  traZODone (DESYREL) 50 MG tablet Take 25 mg by mouth at bedtime.    Yes [provider]  fentaNYL (DURAGESIC - DOSED MCG/HR) 25 MCG/HR patch Place 1 patch (25 mcg total) onto the skin every 3 (three) days. Patient not taking: Reported on 08/15/2017 06/25/17   Luberta Mutter,  Snehalatha, MD  magnesium hydroxide (MILK OF MAGNESIA) 400 MG/5ML suspension Take by mouth daily as needed for mild constipation.    [provider]  metaxalone (SKELAXIN) 800 MG tablet Take 1 tablet (800 mg total) by mouth 3 (three) times daily. Patient not taking: Reported on 08/15/2017 06/23/17   Katha Hamming, MD  traMADol (ULTRAM) 50 MG tablet Take 1 tablet (50 mg total) by mouth every 6 (six) hours as needed for moderate pain. Patient not taking: Reported on 08/15/2017 06/23/17   Katha Hamming, MD    Allergies Keflex [cephalexin]  Family History  Problem Relation Age of Onset  . Bladder Cancer Neg Hx   . Kidney cancer Neg Hx     Social  History Social History  Substance Use Topics  . Smoking status: Former Smoker    Quit date: 11/04/1995  . Smokeless tobacco: Never Used  . Alcohol use No    Review of Systems Constitutional: No fever/chills Eyes: No visual changes. ENT: No sore throat. No stiff neck no neck pain Cardiovascular: Denies chest pain. Respiratory: Denies shortness of breath. Gastrointestinal:   no vomiting.  No diarrhea.  No constipation. Genitourinary: Negative for dysuria. Musculoskeletal: Negative lower extremity swelling Skin: Negative for rash. Neurological: Negative for severe headaches, focal weakness or numbness.   ____________________________________________   PHYSICAL EXAM:  VITAL SIGNS: ED Triage Vitals  Enc Vitals Group     BP 08/15/17 1246 (!) 159/73     Pulse Rate 08/15/17 1246 78     Resp 08/15/17 1246 18     Temp --      Temp Source 08/15/17 1246 Oral     SpO2 08/15/17 1246 100 %     Weight 08/15/17 1246 119 lb (54 kg)     Height --      Head Circumference --      Peak Flow --      Pain Score 08/15/17 1245 4     Pain Loc --      Pain Edu? --      Excl. in GC? --     Constitutional: Alert and orientedto name and place unsure of the exact date. Well appearing and in no acute distress. Eyes: Conjunctivae are normal Head: Atraumatichim a old bruising noted to right temple, yellowed HEENT: No congestion/rhinnorhea. Mucous membranes are moist.  Oropharynx non-erythematous Neck:   Nontender with no meningismus, no masses, no stridor Cardiovascular: Normal rate, regular rhythm. Grossly normal heart sounds.  Good peripheral circulation. Respiratory: Normal respiratory effort.  No retractions. Lungs CTAB. Abdominal: Soft and nontender. No distention. No guarding no rebound Back:  There is no focal tenderness or step off.  there is no midline tenderness there are no lesions noted. there is no CVA tenderness Musculoskeletal: No lower extremity tenderness, no upper extremity  tenderness. No joint effusions, no DVT signs strong distal pulses no edema Neurologic:  Normal speech and language. No gross focal neurologic deficits are appreciated.  Skin:  Skin is warm, dry and intact. No rash noted. Psychiatric: Mood and affect are normal. Speech and behavior are normal.  ____________________________________________   LABS (all labs ordered are listed, but only abnormal results are displayed)  Labs Reviewed - No data to display  Pertinent labs  results that were available during my care of the patient were reviewed by me and considered in my medical decision making (see chart for details). ____________________________________________  EKG  I personally interpreted any EKGs ordered by me or triage rate 63 bpm, likely a  controlled atrial fibrillation, no acute ischemic changes ____________________________________________  RADIOLOGY  Pertinent labs & imaging results that were available during my care of the patient were reviewed by me and considered in my medical decision making (see chart for details). If possible, patient and/or family made aware of any abnormal findings. ____________________________________________    PROCEDURES  Procedure(s) performed: None  Procedures  Critical Care performed: None  ____________________________________________   INITIAL IMPRESSION / ASSESSMENT AND PLAN / ED COURSE  Pertinent labs & imaging results that were available during my care of the patient were reviewed by me and considered in my medical decision making (see chart for details).  patient with what she describes as a non-syncopal fall at the nursing facility, she is DO NOT RESUSCITATE. She has no ongoing complaints or symptoms.the patient does have evidence of an old bruise to her head from prior falls. She has no headache she is neurologically intact and see no other obvious injury. This is not a syncopal event. She did not lose consciousness she remembers  falling. Because she is on aspirin obtain a CT scan of her head and we will reassess.    ____________________________________________   FINAL CLINICAL IMPRESSION(S) / ED DIAGNOSES  Final diagnoses:  None      This chart was dictated using voice recognition software.  Despite best efforts to proofread,  errors can occur which can change meaning.      Jeanmarie Plant, MD 08/15/17 [id number]

## 2017-08-21 NOTE — Progress Notes (Signed)
08/24/2017 8:53 AM   Sheri Webster 02/18/1921 [id number]  Referring provider: No referring provider defined for this encounter.  CC: Urinary retention  HPI: 83 yo WF with a history of urinary retention who underwent a TOV one month ago.  Background history This is a 83 year old female who recently moved to [place]. She underwent foot surgery April 2018 and was evidently in retention postop. The son is not sure if she's had voiding trials and we don't have any of the records. She is tolerating the Foley well. She has no prior GU history or surgery. NG risk includes neuropathy in her legs and she uses a walker.   She was here with her son, [name].   Urine culture from her visit on 06/12/2017 grew out greater than 3 organisms.  She is having nocturia and urinary tract infection as her complaint today.  At her visit on 07/21/2017, she was experiencing urgency x 0-3, frequency x 4-7, not restricting fluids to avoid visits to the restroom, is engaging in toilet mapping, incontinence x 0 and nocturia x 0-3.   Her PVR is 36 mL.     Today, she is experiencing urgency x 0-3 (stable), frequency x 0-3 (stable), not restricting fluids to avoid visits to the restroom, is engaging in toilet mapping, incontinence x 0-3 (stable) and nocturia x 0-3 (stable).  Her PVR is 0 mL.   She denies dysuria, gross hematuria and suprapubic pain. She also does not have a recent complaint of fever, chills, nausea or vomiting.   PMH: Past Medical History:  Diagnosis Date  . Arthritis   . Hyperlipidemia   . Hypertension     Surgical History: Past Surgical History:  Procedure Laterality Date  . ABDOMINAL HYSTERECTOMY    . CARDIAC SURGERY  2004   heart valve replacement    Home Medications:  Allergies as of 08/24/2017      Reactions   Keflex [cephalexin]       Medication List       Accurate as of 08/24/17  8:53 AM. Always use your most recent med list.          acetaminophen 500 MG  tablet Commonly known as:  TYLENOL Take 500 mg by mouth every 6 (six) hours as needed.   alendronate 70 MG tablet Commonly known as:  FOSAMAX Take 1 tablet (70 mg total) by mouth once a week. Take with a full glass of water on an empty stomach.   amLODipine 5 MG tablet Commonly known as:  NORVASC Take 1 tablet (5 mg total) by mouth daily.   aspirin EC 81 MG tablet Take 81 mg by mouth 2 (two) times daily.   bisacodyl 10 MG suppository Commonly known as:  DULCOLAX Place 10 mg rectally as needed for moderate constipation.   calcium-vitamin D 500-200 MG-UNIT tablet Commonly known as:  OSCAL WITH D Take 1 tablet by mouth 2 (two) times daily.   carvedilol 6.25 MG tablet Commonly known as:  COREG Take 6.25 mg by mouth 2 (two) times daily with a meal.   cyclobenzaprine 5 MG tablet Commonly known as:  FLEXERIL Take 5 mg by mouth 3 (three) times daily as needed for muscle spasms.   ergocalciferol 50000 units capsule Commonly known as:  VITAMIN D2 Take 50,000 Units by mouth once a week.   fentaNYL 25 MCG/HR patch Commonly known as:  DURAGESIC - dosed mcg/hr Place 1 patch (25 mcg total) onto the skin every 3 (three) days.   levothyroxine 88 MCG  tablet Commonly known as:  SYNTHROID, LEVOTHROID Take 88 mcg by mouth daily before breakfast.   magnesium hydroxide 400 MG/5ML suspension Commonly known as:  MILK OF MAGNESIA Take by mouth daily as needed for mild constipation.   Melatonin 3 MG Tabs Take 6 mg by mouth at bedtime.   metaxalone 800 MG tablet Commonly known as:  SKELAXIN Take 1 tablet (800 mg total) by mouth 3 (three) times daily.   nystatin powder Generic drug:  nystatin Apply topically 2 (two) times daily.   pravastatin 40 MG tablet Commonly known as:  PRAVACHOL Take 40 mg by mouth daily.   senna 8.6 MG tablet Commonly known as:  SENOKOT Take 2 tablets by mouth 2 (two) times daily.   traMADol 50 MG tablet Commonly known as:  ULTRAM Take 1 tablet (50 mg  total) by mouth every 6 (six) hours as needed for moderate pain.   traZODone 50 MG tablet Commonly known as:  DESYREL Take 25 mg by mouth at bedtime.       Allergies:  Allergies  Allergen Reactions  . Keflex [Cephalexin]     Family History: Family History  Problem Relation Age of Onset  . Bladder Cancer Neg Hx   . Kidney cancer Neg Hx     Social History:  reports that she quit smoking about 21 years ago. She has never used smokeless tobacco. She reports that she does not drink alcohol or use drugs.  ROS: UROLOGY Frequent Urination?: No Hard to postpone urination?: Yes Burning/pain with urination?: No Get up at night to urinate?: No Leakage of urine?: No Urine stream starts and stops?: No Trouble starting stream?: No Do you have to strain to urinate?: No Blood in urine?: No Urinary tract infection?: No Sexually transmitted disease?: No Injury to kidneys or bladder?: No Painful intercourse?: No Weak stream?: No Currently pregnant?: No Vaginal bleeding?: No Last menstrual period?: n  Gastrointestinal Nausea?: No Vomiting?: No Indigestion/heartburn?: No Diarrhea?: No Constipation?: No  Constitutional Fever: No Night sweats?: No Weight loss?: No Fatigue?: No  Skin Skin rash/lesions?: No Itching?: No  Eyes Blurred vision?: No Double vision?: No  Ears/Nose/Throat Sore throat?: No Sinus problems?: No  Hematologic/Lymphatic Swollen glands?: No Easy bruising?: No  Cardiovascular Leg swelling?: No Chest pain?: No  Respiratory Cough?: No Shortness of breath?: No  Endocrine Excessive thirst?: No  Musculoskeletal Back pain?: No Joint pain?: No  Neurological Headaches?: No Dizziness?: No  Psychologic Depression?: No Anxiety?: No  Physical Exam: BP (!) 160/78   Pulse 76   Ht 5\' 3"  (1.6 m)   Wt 126 lb 9.6 oz (57.4 kg)   BMI 22.43 kg/m   Constitutional:  Alert and oriented, No acute distress. HEENT: [place] AT, moist mucus membranes.   Trachea midline, no masses. Cardiovascular: No clubbing, cyanosis, or edema. Respiratory: Normal respiratory effort, no increased work of breathing. GI: Abdomen is soft, nontender, nondistended, no abdominal masses GU: No CVA tenderness. Skin: No rashes, bruises or suspicious lesions. Neurologic: Grossly intact, no focal deficits, moving all 4 extremities. Psychiatric: Normal mood and affect.  Laboratory Data: Cr 0.60 in 06/2017  I have reviewed the labs.  Assessment & Plan:    1. History of urinary retention:     - PVR is minimal today  - RTC in one year for overactive bladder questionnaire and PVR  2. Urge incontinence  - Very hesitant to start an overactive bladder agent at this time as she has a history of urinary retention and that is a  side effect of these medications as well as mental confusion and HTN  - Offered PTNS, the patient felt that transportation would be an issue and did not want to pursue this treatment - her facility does offer pelvic floor physical therapy so we will order that for her  - Patient does have a history of constipation and I have recommended to increase her MOM so that she can have a bowel movement daily  - Also have given her a list of dietary irritants for the bladder and advised her to avoid caffeine, sugar and artificial sweeteners  Return in about 3 months (around 11/24/2017) for PVR and OAB questionnaire.  Michiel Cowboy, PA-C  [facility] [address], [address] [place], [place] [address] [phone]

## 2017-08-24 ENCOUNTER — Encounter: Payer: Self-pay | Admitting: Urology

## 2017-08-24 ENCOUNTER — Ambulatory Visit (INDEPENDENT_AMBULATORY_CARE_PROVIDER_SITE_OTHER): Payer: Medicare Other | Admitting: Urology

## 2017-08-24 VITALS — BP 160/78 | HR 76 | Ht 63.0 in | Wt 126.6 lb

## 2017-08-24 DIAGNOSIS — Z87898 Personal history of other specified conditions: Secondary | ICD-10-CM | POA: Diagnosis not present

## 2017-08-24 DIAGNOSIS — N3941 Urge incontinence: Secondary | ICD-10-CM

## 2017-08-24 LAB — BLADDER SCAN AMB NON-IMAGING: Scan Result: 0

## 2017-09-04 ENCOUNTER — Telehealth: Payer: Self-pay

## 2017-09-04 ENCOUNTER — Ambulatory Visit (INDEPENDENT_AMBULATORY_CARE_PROVIDER_SITE_OTHER): Payer: Medicare Other

## 2017-09-04 DIAGNOSIS — R350 Frequency of micturition: Secondary | ICD-10-CM

## 2017-09-04 LAB — URINALYSIS, COMPLETE
Bilirubin, UA: NEGATIVE
Glucose, UA: NEGATIVE
Nitrite, UA: POSITIVE — AB
PH UA: 5.5 (ref 5.0–7.5)
Specific Gravity, UA: 1.025 (ref 1.005–1.030)
UUROB: 0.2 mg/dL (ref 0.2–1.0)

## 2017-09-04 LAB — MICROSCOPIC EXAMINATION: EPITHELIAL CELLS (NON RENAL): NONE SEEN /HPF (ref 0–10)

## 2017-09-04 MED ORDER — SULFAMETHOXAZOLE-TRIMETHOPRIM 800-160 MG PO TABS: 1.0000 | ORAL_TABLET | Freq: Two times a day (BID) | ORAL | 0 refills | Status: DC

## 2017-09-04 NOTE — Progress Notes (Signed)
Based on the patient's symptoms and positive UA, let us go ahead and treat with Bactrim DS twice daily times 7 days.  We will adjust as needed based on urine culture results.  Please call to inform patient.  Scripted printed, no sure which pharmacy to send it to.   Vanna ScotlandAshley Narely Nobles, MD

## 2017-09-04 NOTE — Progress Notes (Signed)
Patient present today with c/o UTI sx(s):  Urinary Frequency   Bladder Scan Patient can void: 0 ml Performed By: C. Rana SnareLowe, CMA  In and Out Catheterization  An I & O catheterization due to patient unable to void after some time. Patient was cleaned and prepped in a sterile fashion with betadine and Lidocaine 2% jelly was instilled into the urethra.  A 16FR cath was inserted no complications were noted , 50ml of urine return was noted, urine was yellow in color. A clean urine sample was collected for urinalysis. Bladder was drained  And catheter was removed with out difficulty.    Preformed by: C. Rana SnareLowe, CMA   Advised pt and son [name] on u/a and ucx turn-around times. Informed pt  will contact when results are finalized. Pt verbalized understanding.

## 2017-09-04 NOTE — Telephone Encounter (Signed)
Called pt's son to give u/a results, med instructions, and to find out what pharm to send med. No answer. Left vmail per DPR.

## 2017-09-06 LAB — URINE CULTURE

## 2017-09-07 ENCOUNTER — Telehealth: Payer: Self-pay

## 2017-09-07 MED ORDER — AMOXICILLIN-POT CLAVULANATE 875-125 MG PO TABS: 1.0000 | ORAL_TABLET | Freq: Two times a day (BID) | ORAL | 0 refills | Status: DC

## 2017-09-07 NOTE — Telephone Encounter (Signed)
-----   Message from Vanna ScotlandAshley Brandon, MD sent at 09/07/2017  3:12 PM EST ----- Urine culture resulted E. coli, resistant to Bactrim which is previously described.  Please change to Augmentin (standart dose) twice daily times 7 days.  Vanna ScotlandAshley Brandon, MD

## 2017-09-07 NOTE — Telephone Encounter (Signed)
Spoke with pt son in reference to pt needing an abx. Son stated that he does not have a pharmacy set up for pt. Made son aware pt will need an abx. Son stated that he will just pick it up at his pharmacy and take to pt. [facility].

## 2017-09-09 ENCOUNTER — Telehealth: Payer: Self-pay

## 2017-09-09 MED ORDER — AMOXICILLIN-POT CLAVULANATE 875-125 MG PO TABS: 1.0000 | ORAL_TABLET | Freq: Two times a day (BID) | ORAL | 0 refills | Status: DC

## 2017-09-09 NOTE — Telephone Encounter (Signed)
Pt son walked in stating the abx that was [facility] on Monday he was able to pick up and take to the pt at nursing facility. Son stated that he allowed the pt to keep the abx and self administer instead of keeping bottle with him. Son stated that pt got up this morning and abx were missing. Son requested another script for abx. Another script was given for pt to be properly treated. Son stated at this point he will give medication to pt q12h.

## 2017-11-20 ENCOUNTER — Emergency Department: Payer: Medicare Other

## 2017-11-20 ENCOUNTER — Inpatient Hospital Stay
Admission: EM | Admit: 2017-11-20 | Discharge: 2017-11-25 | DRG: 481 | Disposition: A | Discharge status: Hospice | Payer: Medicare Other | Attending: Internal Medicine | Admitting: Internal Medicine

## 2017-11-20 ENCOUNTER — Other Ambulatory Visit: Payer: Self-pay

## 2017-11-20 DIAGNOSIS — Z9189 Other specified personal risk factors, not elsewhere classified: Secondary | ICD-10-CM | POA: Diagnosis not present

## 2017-11-20 DIAGNOSIS — S72001A Fracture of unspecified part of neck of right femur, initial encounter for closed fracture: Secondary | ICD-10-CM

## 2017-11-20 DIAGNOSIS — Z66 Do not resuscitate: Secondary | ICD-10-CM | POA: Diagnosis present

## 2017-11-20 DIAGNOSIS — R531 Weakness: Secondary | ICD-10-CM | POA: Diagnosis not present

## 2017-11-20 DIAGNOSIS — Z7982 Long term (current) use of aspirin: Secondary | ICD-10-CM | POA: Diagnosis not present

## 2017-11-20 DIAGNOSIS — I10 Essential (primary) hypertension: Secondary | ICD-10-CM | POA: Diagnosis present

## 2017-11-20 DIAGNOSIS — Z90710 Acquired absence of both cervix and uterus: Secondary | ICD-10-CM

## 2017-11-20 DIAGNOSIS — Y92129 Unspecified place in nursing home as the place of occurrence of the external cause: Secondary | ICD-10-CM

## 2017-11-20 DIAGNOSIS — F0390 Unspecified dementia without behavioral disturbance: Secondary | ICD-10-CM | POA: Diagnosis present

## 2017-11-20 DIAGNOSIS — E785 Hyperlipidemia, unspecified: Secondary | ICD-10-CM | POA: Diagnosis present

## 2017-11-20 DIAGNOSIS — D62 Acute posthemorrhagic anemia: Secondary | ICD-10-CM | POA: Diagnosis not present

## 2017-11-20 DIAGNOSIS — M1990 Unspecified osteoarthritis, unspecified site: Secondary | ICD-10-CM | POA: Diagnosis present

## 2017-11-20 DIAGNOSIS — S2231XA Fracture of one rib, right side, initial encounter for closed fracture: Secondary | ICD-10-CM | POA: Diagnosis present

## 2017-11-20 DIAGNOSIS — E039 Hypothyroidism, unspecified: Secondary | ICD-10-CM | POA: Diagnosis present

## 2017-11-20 DIAGNOSIS — W19XXXA Unspecified fall, initial encounter: Secondary | ICD-10-CM | POA: Diagnosis present

## 2017-11-20 DIAGNOSIS — S72001S Fracture of unspecified part of neck of right femur, sequela: Secondary | ICD-10-CM | POA: Diagnosis not present

## 2017-11-20 DIAGNOSIS — Z881 Allergy status to other antibiotic agents status: Secondary | ICD-10-CM

## 2017-11-20 DIAGNOSIS — Z952 Presence of prosthetic heart valve: Secondary | ICD-10-CM | POA: Diagnosis not present

## 2017-11-20 DIAGNOSIS — R296 Repeated falls: Secondary | ICD-10-CM | POA: Diagnosis present

## 2017-11-20 DIAGNOSIS — Z87891 Personal history of nicotine dependence: Secondary | ICD-10-CM | POA: Diagnosis not present

## 2017-11-20 DIAGNOSIS — R627 Adult failure to thrive: Secondary | ICD-10-CM | POA: Diagnosis not present

## 2017-11-20 DIAGNOSIS — M25551 Pain in right hip: Secondary | ICD-10-CM | POA: Diagnosis present

## 2017-11-20 DIAGNOSIS — S7221XA Displaced subtrochanteric fracture of right femur, initial encounter for closed fracture: Secondary | ICD-10-CM | POA: Diagnosis present

## 2017-11-20 DIAGNOSIS — Z79890 Hormone replacement therapy (postmenopausal): Secondary | ICD-10-CM | POA: Diagnosis not present

## 2017-11-20 DIAGNOSIS — Z515 Encounter for palliative care: Secondary | ICD-10-CM | POA: Diagnosis not present

## 2017-11-20 DIAGNOSIS — Z7983 Long term (current) use of bisphosphonates: Secondary | ICD-10-CM

## 2017-11-20 DIAGNOSIS — R4182 Altered mental status, unspecified: Secondary | ICD-10-CM | POA: Diagnosis present

## 2017-11-20 HISTORY — DX: Presence of prosthetic heart valve: Z95.2

## 2017-11-20 HISTORY — DX: Unspecified dementia without behavioral disturbance: F03.90

## 2017-11-20 HISTORY — DX: Hypothyroidism, unspecified: E03.9

## 2017-11-20 HISTORY — DX: Unspecified dementia, mild, without behavioral disturbance, psychotic disturbance, mood disturbance, and anxiety: F03.A0

## 2017-11-20 LAB — URINALYSIS, COMPLETE (UACMP) WITH MICROSCOPIC
BACTERIA UA: NONE SEEN
Bilirubin Urine: NEGATIVE
Glucose, UA: 50 mg/dL — AB
KETONES UR: NEGATIVE mg/dL
Nitrite: NEGATIVE
PROTEIN: NEGATIVE mg/dL
Specific Gravity, Urine: 1.016 (ref 1.005–1.030)
Squamous Epithelial / LPF: NONE SEEN
pH: 5 (ref 5.0–8.0)

## 2017-11-20 LAB — BASIC METABOLIC PANEL
Anion gap: 10 (ref 5–15)
BUN: 18 mg/dL (ref 6–20)
CHLORIDE: 103 mmol/L (ref 101–111)
CO2: 26 mmol/L (ref 22–32)
CREATININE: 0.87 mg/dL (ref 0.44–1.00)
Calcium: 9.6 mg/dL (ref 8.9–10.3)
GFR calc non Af Amer: 55 mL/min — ABNORMAL LOW (ref >60)
GLUCOSE: 125 mg/dL — AB (ref 65–99)
Potassium: 3.9 mmol/L (ref 3.5–5.1)
Sodium: 139 mmol/L (ref 135–145)

## 2017-11-20 LAB — CBC WITH DIFFERENTIAL/PLATELET
Basophils Absolute: 0 10*3/uL (ref 0–0.1)
Basophils Relative: 0 %
EOS ABS: 0.1 10*3/uL (ref 0–0.7)
Eosinophils Relative: 2 %
HEMATOCRIT: 38.8 % (ref 35.0–47.0)
HEMOGLOBIN: 13.2 g/dL (ref 12.0–16.0)
LYMPHS ABS: 1.1 10*3/uL (ref 1.0–3.6)
Lymphocytes Relative: 17 %
MCH: 32.3 pg (ref 26.0–34.0)
MCHC: 34 g/dL (ref 32.0–36.0)
MCV: 94.7 fL (ref 80.0–100.0)
MONOS PCT: 8 %
Monocytes Absolute: 0.6 10*3/uL (ref 0.2–0.9)
NEUTROS PCT: 73 %
Neutro Abs: 4.8 10*3/uL (ref 1.4–6.5)
Platelets: 260 10*3/uL (ref 150–440)
RBC: 4.1 MIL/uL (ref 3.80–5.20)
RDW: 15.7 % — ABNORMAL HIGH (ref 11.5–14.5)
WBC: 6.6 10*3/uL (ref 3.6–11.0)

## 2017-11-20 LAB — SURGICAL PCR SCREEN
MRSA, PCR: NEGATIVE
Staphylococcus aureus: NEGATIVE

## 2017-11-20 LAB — PROTIME-INR
INR: 1.09
PROTHROMBIN TIME: 14 s (ref 11.4–15.2)

## 2017-11-20 MED ORDER — LEVOTHYROXINE SODIUM 88 MCG PO TABS
88.0000 ug | ORAL_TABLET | Freq: Every day | ORAL | Status: DC
  Administered 2017-11-22 – 2017-11-23 (×2): 88 ug via ORAL
  Filled 2017-11-20 (×4): qty 1

## 2017-11-20 MED ORDER — CHLORHEXIDINE GLUCONATE 4 % EX LIQD
Freq: Once | CUTANEOUS | Status: AC
  Administered 2017-11-21: via TOPICAL

## 2017-11-20 MED ORDER — PRAVASTATIN SODIUM 20 MG PO TABS
40.0000 mg | ORAL_TABLET | Freq: Every day | ORAL | Status: DC
  Administered 2017-11-22 – 2017-11-23 (×2): 40 mg via ORAL
  Filled 2017-11-20 (×3): qty 2

## 2017-11-20 MED ORDER — AMLODIPINE BESYLATE 5 MG PO TABS
5.0000 mg | ORAL_TABLET | Freq: Every day | ORAL | Status: DC
  Administered 2017-11-20 – 2017-11-23 (×2): 5 mg via ORAL
  Filled 2017-11-20 (×2): qty 1

## 2017-11-20 MED ORDER — POLYETHYLENE GLYCOL 3350 17 G PO PACK: 17.0000 g | PACK | Freq: Every day | ORAL | Status: DC | PRN

## 2017-11-20 MED ORDER — CYCLOBENZAPRINE HCL 10 MG PO TABS: 5.0000 mg | ORAL_TABLET | Freq: Three times a day (TID) | ORAL | Status: DC | PRN

## 2017-11-20 MED ORDER — HYDROCODONE-ACETAMINOPHEN 5-325 MG PO TABS: 1.0000 | ORAL_TABLET | ORAL | Status: DC | PRN

## 2017-11-20 MED ORDER — CARVEDILOL 3.125 MG PO TABS
6.2500 mg | ORAL_TABLET | Freq: Two times a day (BID) | ORAL | Status: DC
  Administered 2017-11-21 – 2017-11-23 (×4): 6.25 mg via ORAL
  Filled 2017-11-20 (×4): qty 2
  Filled 2017-11-20: qty 1
  Filled 2017-11-20: qty 2
  Filled 2017-11-20: qty 1

## 2017-11-20 MED ORDER — ONDANSETRON HCL 4 MG PO TABS: 4.0000 mg | ORAL_TABLET | Freq: Four times a day (QID) | ORAL | Status: DC | PRN

## 2017-11-20 MED ORDER — SENNA 8.6 MG PO TABS
1.0000 | ORAL_TABLET | Freq: Two times a day (BID) | ORAL | Status: DC
  Administered 2017-11-20: 8.6 mg via ORAL
  Filled 2017-11-20: qty 1

## 2017-11-20 MED ORDER — MORPHINE SULFATE (PF) 2 MG/ML IV SOLN
2.0000 mg | Freq: Once | INTRAVENOUS | Status: AC
  Administered 2017-11-20: 2 mg via INTRAVENOUS
  Filled 2017-11-20: qty 1

## 2017-11-20 MED ORDER — OXYCODONE HCL 5 MG PO TABS
5.0000 mg | ORAL_TABLET | ORAL | Status: DC | PRN
  Administered 2017-11-22: 5 mg via ORAL
  Filled 2017-11-20: qty 1

## 2017-11-20 MED ORDER — ONDANSETRON HCL 4 MG/2ML IJ SOLN
4.0000 mg | Freq: Four times a day (QID) | INTRAMUSCULAR | Status: DC | PRN
Start: 2017-11-20 — End: 2017-11-25

## 2017-11-20 MED ORDER — ALBUTEROL SULFATE (2.5 MG/3ML) 0.083% IN NEBU: 2.5000 mg | INHALATION_SOLUTION | RESPIRATORY_TRACT | Status: DC | PRN

## 2017-11-20 MED ORDER — SODIUM CHLORIDE 0.9 % IV SOLN
INTRAVENOUS | Status: DC
  Administered 2017-11-20: 19:00:00 via INTRAVENOUS

## 2017-11-20 MED ORDER — BISACODYL 10 MG RE SUPP
10.0000 mg | Freq: Every day | RECTAL | Status: DC | PRN
  Administered 2017-11-23: 10 mg via RECTAL
  Filled 2017-11-20: qty 1

## 2017-11-20 MED ORDER — MORPHINE SULFATE (PF) 2 MG/ML IV SOLN
1.0000 mg | INTRAVENOUS | Status: DC | PRN
Start: 2017-11-20 — End: 2017-11-21

## 2017-11-20 MED ORDER — ACETAMINOPHEN 650 MG RE SUPP
650.0000 mg | Freq: Four times a day (QID) | RECTAL | Status: DC | PRN
  Administered 2017-11-23: 650 mg via RECTAL
  Filled 2017-11-20: qty 1

## 2017-11-20 MED ORDER — HYDRALAZINE HCL 20 MG/ML IJ SOLN: 10.0000 mg | Freq: Four times a day (QID) | INTRAMUSCULAR | Status: DC | PRN

## 2017-11-20 MED ORDER — ACETAMINOPHEN 325 MG PO TABS
650.0000 mg | ORAL_TABLET | Freq: Four times a day (QID) | ORAL | Status: DC | PRN
  Administered 2017-11-20 – 2017-11-21 (×2): 650 mg via ORAL
  Filled 2017-11-20 (×2): qty 2

## 2017-11-20 MED ORDER — DOCUSATE SODIUM 100 MG PO CAPS
100.0000 mg | ORAL_CAPSULE | Freq: Two times a day (BID) | ORAL | Status: DC
  Administered 2017-11-20 – 2017-11-22 (×3): 100 mg via ORAL
  Filled 2017-11-20 (×4): qty 1

## 2017-11-20 MED ORDER — TRAZODONE HCL 50 MG PO TABS
25.0000 mg | ORAL_TABLET | Freq: Every day | ORAL | Status: DC
  Administered 2017-11-20 – 2017-11-22 (×2): 25 mg via ORAL
  Filled 2017-11-20 (×2): qty 1

## 2017-11-20 MED ORDER — CLINDAMYCIN PHOSPHATE 600 MG/50ML IV SOLN
600.0000 mg | INTRAVENOUS | Status: AC
  Filled 2017-11-20 (×2): qty 50

## 2017-11-20 NOTE — ED Notes (Signed)
Called Madolyn Friezeana Duggins RN

## 2017-11-20 NOTE — ED Notes (Signed)
This RN attempted to call pt son at both numbers listed in emergency contacts. No answer at either number.

## 2017-11-20 NOTE — Consult Note (Signed)
ORTHOPAEDIC CONSULTATION  REQUESTING PHYSICIAN: Milagros Loll, MD  Chief Complaint: Right hip pain  HPI: Sheri Webster is a 84 y.o. female who complains of right hip pain following a fall at her nursing home today.  The patient was brought to the emergency room where exam and x-rays reveal a displaced subtrochanteric fracture of the right proximal femur.  Patient is being admitted for medical evaluation and preparation for possible surgery.  Her family has not been contacted yet.  Will discuss treatment options with them when they are available.  Patient is confused and cannot give her own permission.  She would be best served with a right femoral rodding with a cephalo-medullary nail.  Past Medical History:  Diagnosis Date  . Arthritis   . H/O prosthetic heart valve   . Hyperlipidemia   . Hypertension   . Hypothyroidism   . Mild dementia    Past Surgical History:  Procedure Laterality Date  . ABDOMINAL HYSTERECTOMY    . CARDIAC SURGERY  2004   heart valve replacement   Social History   Socioeconomic History  . Marital status: Widowed    Spouse name: None  . Number of children: None  . Years of education: None  . Highest education level: None  Social Needs  . Financial resource strain: None  . Food insecurity - worry: None  . Food insecurity - inability: None  . Transportation needs - medical: None  . Transportation needs - non-medical: None  Occupational History  . None  Tobacco Use  . Smoking status: Former Smoker    Last attempt to quit: 11/04/1995    Years since quitting: 22.0  . Smokeless tobacco: Never Used  Substance and Sexual Activity  . Alcohol use: No  . Drug use: No  . Sexual activity: None  Other Topics Concern  . None  Social History Narrative  . None   Family History  Problem Relation Age of Onset  . Bladder Cancer Neg Hx   . Kidney cancer Neg Hx    Allergies  Allergen Reactions  . Keflex [Cephalexin]    Prior to Admission medications    Medication Sig Start Date End Date Taking? Authorizing Provider  acetaminophen (TYLENOL) 500 MG tablet Take 500 mg by mouth every 6 (six) hours as needed.   Yes [provider]  alendronate (FOSAMAX) 70 MG tablet Take 1 tablet (70 mg total) by mouth once a week. Take with a full glass of water on an empty stomach. 06/24/17  Yes Wieting, Richard, MD  aspirin EC 81 MG tablet Take 81 mg by mouth 2 (two) times daily.    Yes [provider]  bisacodyl (DULCOLAX) 10 MG suppository Place 10 mg rectally as needed for moderate constipation.   Yes [provider]  calcium-vitamin D (OSCAL WITH D) 500-200 MG-UNIT tablet Take 1 tablet by mouth 2 (two) times daily.    Yes [provider]  carvedilol (COREG) 6.25 MG tablet Take 6.25 mg by mouth 2 (two) times daily with a meal.   Yes [provider]  cyclobenzaprine (FLEXERIL) 5 MG tablet Take 5 mg by mouth 3 (three) times daily as needed for muscle spasms.   Yes [provider]  ergocalciferol (VITAMIN D2) 50000 units capsule Take 50,000 Units by mouth once a week.   Yes [provider]  furosemide (LASIX) 20 MG tablet Take 10 mg by mouth daily.   Yes [provider]  levothyroxine (SYNTHROID, LEVOTHROID) 88 MCG tablet Take 88 mcg by  mouth daily before breakfast.   Yes [provider]  magnesium hydroxide (MILK OF MAGNESIA) 400 MG/5ML suspension Take by mouth daily as needed for mild constipation.   Yes [provider]  Melatonin 3 MG TABS Take 6 mg by mouth at bedtime.    Yes [provider]  nystatin (NYSTATIN) powder Apply topically daily.    Yes [provider]  pravastatin (PRAVACHOL) 40 MG tablet Take 40 mg by mouth daily.   Yes [provider]  senna (SENOKOT) 8.6 MG tablet Take 2 tablets by mouth 2 (two) times daily.    Yes [provider]  traZODone (DESYREL) 50 MG tablet Take 25 mg by mouth at bedtime.    Yes [provider]  amLODipine (NORVASC) 5 MG tablet Take 1 tablet (5 mg total) by mouth daily. Patient not taking: Reported on 11/20/2017 06/24/17   Katha HammingKonidena, Snehalatha, MD  amoxicillin-clavulanate (AUGMENTIN) 875-125 MG tablet Take 1 tablet every 12 (twelve) hours by mouth. Patient not taking: Reported on 11/20/2017 09/09/17   Vanna ScotlandBrandon, Ashley, MD  fentaNYL (DURAGESIC - DOSED MCG/HR) 25 MCG/HR patch Place 1 patch (25 mcg total) onto the skin every 3 (three) days. Patient not taking: Reported on 08/15/2017 06/25/17   Katha HammingKonidena, Snehalatha, MD  metaxalone (SKELAXIN) 800 MG tablet Take 1 tablet (800 mg total) by mouth 3 (three) times daily. Patient not taking: Reported on 08/15/2017 06/23/17   Katha HammingKonidena, Snehalatha, MD  sulfamethoxazole-trimethoprim (BACTRIM DS,SEPTRA DS) 800-160 MG tablet Take 1 tablet by mouth 2 (two) times daily. Patient not taking: Reported on 11/20/2017 09/04/17   Vanna ScotlandBrandon, Ashley, MD  traMADol (ULTRAM) 50 MG tablet Take 1 tablet (50 mg total) by mouth every 6 (six) hours as needed for moderate pain. Patient not taking: Reported on 08/15/2017 06/23/17   Katha HammingKonidena, Snehalatha, MD   Dg Chest 1 View  Result Date: 11/20/2017 CLINICAL DATA:  Unwitnessed fall today. RIGHT hip pain. History of hypertension. EXAM: CHEST 1 VIEW COMPARISON:  Chest radiograph June 18, 2017 FINDINGS: The cardiac silhouette is moderately enlarged and unchanged. Status post aortic valve replacement. Mild chronic interstitial changes without pleural effusion or focal consolidation. LEFT midlung zone atelectasis. No pneumothorax. Osteopenia. Acute appearing nondisplaced RIGHT seventh rib fracture with subacute versus old surrounding rib fractures, some of which are new from prior radiograph. IMPRESSION: Stable cardiomegaly and chronic interstitial changes. LEFT midlung zone atelectasis. Acute appearing RIGHT seventh rib fracture, additional subacute chronic appearing RIGHT rib fractures. Electronically Signed   By: Awilda Metroourtnay   Bloomer M.D.   On: 11/20/2017 15:49   Dg Hip Unilat W Or Wo Pelvis 2-3 Views Right  Result Date: 11/20/2017 CLINICAL DATA:  Unwitnessed fall today. Pt c/o right hip pain. Hx of arthritis, HTN. Former smoker. No known previous surgery to right hip. EXAM: DG HIP (WITH OR WITHOUT PELVIS) 2-3V RIGHT COMPARISON:  Pelvic radiograph 08/15/2017 FINDINGS: Oblique fracture through the proximal femur at the level of the inferior trochanter. There is over ride with superior migration of the distal fractured femur and medial displacement. Significant angulation noted on the cross-table lateral with the distal aspect of the proximal fracture fragment approaching askin surface. IMPRESSION: 1. Oblique fracture through the proximal femur at the level of the inferior trochanter. 2. Medial displacement and cephalad migration of the distal fracture fragment. 3. Significant angulation noted on the lateral projection with the proximal fracture fragment approaching the skin surface Electronically Signed   By: Genevive BiStewart  Edmunds M.D.   On: 11/20/2017 15:48    Positive ROS: All  other systems have been reviewed and were otherwise negative with the exception of those mentioned in the HPI and as above.  Physical Exam: General: Alert, no acute distress Cardiovascular: No pedal edema Respiratory: No cyanosis, no use of accessory musculature GI: No organomegaly, abdomen is soft and non-tender Skin: No lesions in the area of chief complaint Neurologic: Sensation intact distally Psychiatric: Patient is competent for consent with normal mood and affect Lymphatic: No axillary or cervical lymphadenopathy  MUSCULOSKELETAL: Patient is confused but alert and vocal.  The right leg is short and severely rotated.  Neurovascular status good distally.  Skin is intact.  There is severe pain with movement of the hip.  No evidence of other bony injury.  Assessment: Displaced subtrochanteric fracture right femur Mild dementia  Plan: Right  femoral rodding tomorrow if permission is obtained.  She has been cleared medically by the hospitalist service for surgery.    Valinda Hoar, MD [phone]   11/20/2017 6:40 PM

## 2017-11-20 NOTE — ED Notes (Signed)
Patient hard of hearing

## 2017-11-20 NOTE — ED Notes (Signed)
EMS reports patient fell today at around 1410, unwitnessed fall. EMS reports pt c/o right hip pain.S V/S 198/80,96% RA, HR 64 

## 2017-11-20 NOTE — ED Provider Notes (Signed)
[facility] Emergency Department Provider Note  ____________________________________________  Time seen: Approximately 4:28 PM  I have reviewed the triage vital signs and the nursing notes.   HISTORY  Chief Complaint Hip Pain (Hx Fx Right lower Leg, C/O right hip pain)    HPI Sheri Webster is a 84 y.o. female sent to the ED from nursing home due to an unwitnessed fall around 2 PM today. Patient complains of right hip pain. No other complaints. Pain is mild, constant, worse with movement, no alleviating factors. Nonradiating.     Past Medical History:  Diagnosis Date  . Arthritis   . H/O prosthetic heart valve   . Hyperlipidemia   . Hypertension   . Hypothyroidism   . Mild dementia      Patient Active Problem List   Diagnosis Date Noted  . Hip fracture (HCC) 11/20/2017  . Fall   . Dehydration   . Acute midline thoracic back pain   . Palliative care by specialist   . Goals of care, counseling/discussion   . Syncope 06/18/2017     Past Surgical History:  Procedure Laterality Date  . ABDOMINAL HYSTERECTOMY    . CARDIAC SURGERY  2004   heart valve replacement     Prior to Admission medications   Medication Sig Start Date End Date Taking? Authorizing Provider  acetaminophen (TYLENOL) 500 MG tablet Take 500 mg by mouth every 6 (six) hours as needed.   Yes [provider]  alendronate (FOSAMAX) 70 MG tablet Take 1 tablet (70 mg total) by mouth once a week. Take with a full glass of water on an empty stomach. 06/24/17  Yes Wieting, Richard, MD  aspirin EC 81 MG tablet Take 81 mg by mouth 2 (two) times daily.    Yes [provider]  bisacodyl (DULCOLAX) 10 MG suppository Place 10 mg rectally as needed for moderate constipation.   Yes [provider]  calcium-vitamin D (OSCAL WITH D) 500-200 MG-UNIT tablet Take 1 tablet by mouth 2 (two) times daily.    Yes [provider]  carvedilol (COREG) 6.25 MG tablet Take  6.25 mg by mouth 2 (two) times daily with a meal.   Yes [provider]  cyclobenzaprine (FLEXERIL) 5 MG tablet Take 5 mg by mouth 3 (three) times daily as needed for muscle spasms.   Yes [provider]  ergocalciferol (VITAMIN D2) 50000 units capsule Take 50,000 Units by mouth once a week.   Yes [provider]  furosemide (LASIX) 20 MG tablet Take 10 mg by mouth daily.   Yes [provider]  levothyroxine (SYNTHROID, LEVOTHROID) 88 MCG tablet Take 88 mcg by mouth daily before breakfast.   Yes [provider]  magnesium hydroxide (MILK OF MAGNESIA) 400 MG/5ML suspension Take by mouth daily as needed for mild constipation.   Yes [provider]  Melatonin 3 MG TABS Take 6 mg by mouth at bedtime.    Yes [provider]  nystatin (NYSTATIN) powder Apply topically daily.    Yes [provider]  pravastatin (PRAVACHOL) 40 MG tablet Take 40 mg by mouth daily.   Yes [provider]  senna (SENOKOT) 8.6 MG tablet Take 2 tablets by mouth 2 (two) times daily.    Yes [provider]  traZODone (DESYREL) 50 MG tablet Take 25 mg by mouth at bedtime.    Yes [provider]  amLODipine (NORVASC) 5 MG tablet Take 1 tablet (5 mg total) by mouth daily. Patient not  taking: Reported on 11/20/2017 06/24/17   Katha Hamming, MD  amoxicillin-clavulanate (AUGMENTIN) 875-125 MG tablet Take 1 tablet every 12 (twelve) hours by mouth. Patient not taking: Reported on 11/20/2017 09/09/17   Vanna Scotland, MD  fentaNYL (DURAGESIC - DOSED MCG/HR) 25 MCG/HR patch Place 1 patch (25 mcg total) onto the skin every 3 (three) days. Patient not taking: Reported on 08/15/2017 06/25/17   Katha Hamming, MD  metaxalone (SKELAXIN) 800 MG tablet Take 1 tablet (800 mg total) by mouth 3 (three) times daily. Patient not taking: Reported on 08/15/2017 06/23/17   Katha Hamming, MD  sulfamethoxazole-trimethoprim (BACTRIM DS,SEPTRA  DS) 800-160 MG tablet Take 1 tablet by mouth 2 (two) times daily. Patient not taking: Reported on 11/20/2017 09/04/17   Vanna Scotland, MD  traMADol (ULTRAM) 50 MG tablet Take 1 tablet (50 mg total) by mouth every 6 (six) hours as needed for moderate pain. Patient not taking: Reported on 08/15/2017 06/23/17   Katha Hamming, MD     Allergies Keflex [cephalexin]   Family History  Problem Relation Age of Onset  . Bladder Cancer Neg Hx   . Kidney cancer Neg Hx     Social History Social History   Tobacco Use  . Smoking status: Former Smoker    Last attempt to quit: 11/04/1995    Years since quitting: 22.0  . Smokeless tobacco: Never Used  Substance Use Topics  . Alcohol use: No  . Drug use: No    Review of Systems  Constitutional:   No fever or chills.   Cardiovascular:   No chest pain or syncope. Respiratory:   No dyspnea or cough. Gastrointestinal:   Negative for abdominal pain, vomiting and diarrhea.  Musculoskeletal:  right hip pain as above All other systems reviewed and are negative except as documented above in ROS and HPI.  ____________________________________________   PHYSICAL EXAM:  VITAL SIGNS: ED Triage Vitals  Enc Vitals Group     BP 11/20/17 1446 (!) 203/102     Pulse Rate 11/20/17 1446 67     Resp 11/20/17 1446 18     Temp --      Temp src --      SpO2 11/20/17 1446 92 %     Weight 11/20/17 1448 132 lb (59.9 kg)     Height 11/20/17 1448 5\' 5"  (1.651 m)     Head Circumference --      Peak Flow --      Pain Score 11/20/17 1445 8     Pain Loc --      Pain Edu? --      Excl. in GC? --     Vital signs reviewed, nursing assessments reviewed.   Constitutional:   Alert and oriented. Well appearing and in no distress. Eyes:   No scleral icterus.  EOMI. No nystagmus. No conjunctival pallor. PERRL. ENT   Head:   Normocephalic and atraumatic.   Nose:   No congestion/rhinnorhea.    Mouth/Throat:   MMM, no pharyngeal erythema. No  peritonsillar mass.    Neck:   No meningismus. Full ROM.No C-spine tenderness Hematological/Lymphatic/Immunilogical:   No cervical lymphadenopathy. Cardiovascular:   RRR. Symmetric bilateral radial and DP pulses.  No murmurs.  Respiratory:   Normal respiratory effort without tachypnea/retractions. Breath sounds are clear and equal bilaterally. No wheezes/rales/rhonchi. Gastrointestinal:   Soft and nontender. Non distended. There is no CVA tenderness.  No rebound, rigidity, or guarding. Genitourinary:   deferred Musculoskeletal:  relative emptiness over the right hip joint with  soft tissue fullness over the proximal right femoral diaphysis. Not significantly tender. Pain with leg movement. Extremities otherwise unremarkable. No overlying laceration Neurologic:   Normal speech and language.  Motor grossly intact. No acute focal neurologic deficits are appreciated.  Skin:    Skin is warm, dry and intact. No rash noted.  No petechiae, purpura, or bullae.  ____________________________________________    LABS (pertinent positives/negatives) (all labs ordered are listed, but only abnormal results are displayed) Labs Reviewed  BASIC METABOLIC PANEL - Abnormal; Notable for the following components:      Result Value   Glucose, Bld 125 (*)    GFR calc non Af Amer 55 (*)    All other components within normal limits  CBC WITH DIFFERENTIAL/PLATELET - Abnormal; Notable for the following components:   RDW 15.7 (*)    All other components within normal limits  URINE CULTURE  URINALYSIS, COMPLETE (UACMP) WITH MICROSCOPIC  PROTIME-INR  BASIC METABOLIC PANEL  CBC  TYPE AND SCREEN   ____________________________________________   EKG    ____________________________________________    RADIOLOGY  Dg Chest 1 View  Result Date: 11/20/2017 CLINICAL DATA:  Unwitnessed fall today. RIGHT hip pain. History of hypertension. EXAM: CHEST 1 VIEW COMPARISON:  Chest radiograph June 18, 2017  FINDINGS: The cardiac silhouette is moderately enlarged and unchanged. Status post aortic valve replacement. Mild chronic interstitial changes without pleural effusion or focal consolidation. LEFT midlung zone atelectasis. No pneumothorax. Osteopenia. Acute appearing nondisplaced RIGHT seventh rib fracture with subacute versus old surrounding rib fractures, some of which are new from prior radiograph. IMPRESSION: Stable cardiomegaly and chronic interstitial changes. LEFT midlung zone atelectasis. Acute appearing RIGHT seventh rib fracture, additional subacute chronic appearing RIGHT rib fractures. Electronically Signed   By: Awilda Metroourtnay  Bloomer M.D.   On: 11/20/2017 15:49   Dg Hip Unilat W Or Wo Pelvis 2-3 Views Right  Result Date: 11/20/2017 CLINICAL DATA:  Unwitnessed fall today. Pt c/o right hip pain. Hx of arthritis, HTN. Former smoker. No known previous surgery to right hip. EXAM: DG HIP (WITH OR WITHOUT PELVIS) 2-3V RIGHT COMPARISON:  Pelvic radiograph 08/15/2017 FINDINGS: Oblique fracture through the proximal femur at the level of the inferior trochanter. There is over ride with superior migration of the distal fractured femur and medial displacement. Significant angulation noted on the cross-table lateral with the distal aspect of the proximal fracture fragment approaching askin surface. IMPRESSION: 1. Oblique fracture through the proximal femur at the level of the inferior trochanter. 2. Medial displacement and cephalad migration of the distal fracture fragment. 3. Significant angulation noted on the lateral projection with the proximal fracture fragment approaching the skin surface Electronically Signed   By: Genevive BiStewart  Edmunds M.D.   On: 11/20/2017 15:48    ____________________________________________   PROCEDURES Procedures  ____________________________________________    CLINICAL IMPRESSION / ASSESSMENT AND PLAN / ED COURSE  Pertinent labs & imaging results that were available during my  care of the patient were reviewed by me and considered in my medical decision making (see chart for details).   patient presents with right hip pain after a fall. I'll check a chest x-ray and right hip x-ray as well as labs. Not in significant pain at this time, not requiring pain control on initial assessment.  Clinical Course as of Nov 20 1718  Fri Nov 20, 2017  1702 Pt now exhibiting pain. Will give iv morhphine. Case d/w hospitalist.   [PS]    Clinical Course User Index [PS] Sharman CheekStafford, Simran Bomkamp, MD     -----------------------------------------  4:32 PM on 11/20/2017 -----------------------------------------  Discussed with Dr. Hyacinth Meeker, who planned surgical repair tomorrow morning. I'll discuss the case with the hospitalist for admission.   ----------------------------------------- 5:20 PM on 11/20/2017 -----------------------------------------  Discussed with hospitalist. ____________________________________________   FINAL CLINICAL IMPRESSION(S) / ED DIAGNOSES    Final diagnoses:  Acute pain of right hip  Closed right hip fracture, initial encounter (HCC)  Closed fracture of one rib of right side, initial encounter       Portions of this note were generated with dragon dictation software. Dictation errors may occur despite best attempts at proofreading.    Sharman Cheek, MD 11/20/17 1721

## 2017-11-20 NOTE — ED Triage Notes (Signed)
EMS reports patient fell today at around 1410, unwitnessed fall. EMS reports pt c/o right hip pain.S V/S 198/80,96% RA, HR 64

## 2017-11-20 NOTE — H&P (Signed)
[facility] [place] [facility]   PATIENT NAME: Sheri Webster    MR#:  [id number]  DATE OF BIRTH:  31-Jul-1921  DATE OF ADMISSION:  11/20/2017  PRIMARY CARE PHYSICIAN: Patient, No Pcp Per   REQUESTING/REFERRING PHYSICIAN: Dr. Scotty Court  CHIEF COMPLAINT:   Chief Complaint  Patient presents with  . Hip Pain    Hx Fx Right lower Leg, C/O right hip pain    HISTORY OF PRESENT ILLNESS:  Sheri Webster  is a 84 y.o. female with a known history of prosthetic heart valve, mild dementia, hypertension, recurrent falls, hypothyroidism, atrial fibrillation not on anticoagulation presents to the emergency room from local nursing home after having a mechanical fall with right hip pain.  Patient has a proximal femur fracture with significant angulation.  Orthopedics consulted.  Surgery scheduled for tomorrow.  Patient has no active chest pain or shortness of breath.  Heart rate is well controlled.  PAST MEDICAL HISTORY:   Past Medical History:  Diagnosis Date  . Arthritis   . H/O prosthetic heart valve   . Hyperlipidemia   . Hypertension   . Hypothyroidism   . Mild dementia     PAST SURGICAL HISTORY:   Past Surgical History:  Procedure Laterality Date  . ABDOMINAL HYSTERECTOMY    . CARDIAC SURGERY  2004   heart valve replacement    SOCIAL HISTORY:   Social History   Tobacco Use  . Smoking status: Former Smoker    Last attempt to quit: 11/04/1995    Years since quitting: 22.0  . Smokeless tobacco: Never Used  Substance Use Topics  . Alcohol use: No    FAMILY HISTORY:   Family History  Problem Relation Age of Onset  . Bladder Cancer Neg Hx   . Kidney cancer Neg Hx     DRUG ALLERGIES:   Allergies  Allergen Reactions  . Keflex [Cephalexin]     REVIEW OF SYSTEMS:   Review of Systems  Constitutional: Positive for malaise/fatigue. Negative for chills and fever.  HENT: Negative for sore throat.   Eyes: Negative for blurred vision, double vision and  pain.  Respiratory: Negative for cough, hemoptysis, shortness of breath and wheezing.   Cardiovascular: Negative for chest pain, palpitations, orthopnea and leg swelling.  Gastrointestinal: Negative for abdominal pain, constipation, diarrhea, heartburn, nausea and vomiting.  Genitourinary: Negative for dysuria and hematuria.  Musculoskeletal: Positive for back pain, falls and joint pain.  Skin: Negative for rash.  Neurological: Positive for weakness. Negative for sensory change, speech change, focal weakness and headaches.  Endo/Heme/Allergies: Does not bruise/bleed easily.  Psychiatric/Behavioral: Positive for memory loss. Negative for depression. The patient is not nervous/anxious.    MEDICATIONS AT HOME:   Prior to Admission medications   Medication Sig Start Date End Date Taking? Authorizing Provider  acetaminophen (TYLENOL) 500 MG tablet Take 500 mg by mouth every 6 (six) hours as needed.   Yes [provider]  alendronate (FOSAMAX) 70 MG tablet Take 1 tablet (70 mg total) by mouth once a week. Take with a full glass of water on an empty stomach. 06/24/17  Yes Wieting, Richard, MD  aspirin EC 81 MG tablet Take 81 mg by mouth 2 (two) times daily.    Yes [provider]  bisacodyl (DULCOLAX) 10 MG suppository Place 10 mg rectally as needed for moderate constipation.   Yes [provider]  calcium-vitamin D (OSCAL WITH D) 500-200 MG-UNIT tablet Take 1 tablet by mouth 2 (two) times daily.  Yes [provider]  carvedilol (COREG) 6.25 MG tablet Take 6.25 mg by mouth 2 (two) times daily with a meal.   Yes [provider]  cyclobenzaprine (FLEXERIL) 5 MG tablet Take 5 mg by mouth 3 (three) times daily as needed for muscle spasms.   Yes [provider]  ergocalciferol (VITAMIN D2) 50000 units capsule Take 50,000 Units by mouth once a week.   Yes [provider]  furosemide (LASIX) 20 MG tablet Take 10 mg by mouth daily.   Yes  [provider]  levothyroxine (SYNTHROID, LEVOTHROID) 88 MCG tablet Take 88 mcg by mouth daily before breakfast.   Yes [provider]  magnesium hydroxide (MILK OF MAGNESIA) 400 MG/5ML suspension Take by mouth daily as needed for mild constipation.   Yes [provider]  Melatonin 3 MG TABS Take 6 mg by mouth at bedtime.    Yes [provider]  nystatin (NYSTATIN) powder Apply topically daily.    Yes [provider]  pravastatin (PRAVACHOL) 40 MG tablet Take 40 mg by mouth daily.   Yes [provider]  senna (SENOKOT) 8.6 MG tablet Take 2 tablets by mouth 2 (two) times daily.    Yes [provider]  traZODone (DESYREL) 50 MG tablet Take 25 mg by mouth at bedtime.    Yes [provider]  amLODipine (NORVASC) 5 MG tablet Take 1 tablet (5 mg total) by mouth daily. Patient not taking: Reported on 11/20/2017 06/24/17   Katha Hamming, MD  amoxicillin-clavulanate (AUGMENTIN) 875-125 MG tablet Take 1 tablet every 12 (twelve) hours by mouth. Patient not taking: Reported on 11/20/2017 09/09/17   Vanna Scotland, MD  fentaNYL (DURAGESIC - DOSED MCG/HR) 25 MCG/HR patch Place 1 patch (25 mcg total) onto the skin every 3 (three) days. Patient not taking: Reported on 08/15/2017 06/25/17   Katha Hamming, MD  metaxalone (SKELAXIN) 800 MG tablet Take 1 tablet (800 mg total) by mouth 3 (three) times daily. Patient not taking: Reported on 08/15/2017 06/23/17   Katha Hamming, MD  sulfamethoxazole-trimethoprim (BACTRIM DS,SEPTRA DS) 800-160 MG tablet Take 1 tablet by mouth 2 (two) times daily. Patient not taking: Reported on 11/20/2017 09/04/17   Vanna Scotland, MD  traMADol (ULTRAM) 50 MG tablet Take 1 tablet (50 mg total) by mouth every 6 (six) hours as needed for moderate pain. Patient not taking: Reported on 08/15/2017 06/23/17   Katha Hamming, MD   VITAL SIGNS:  Blood pressure (!) 203/102, pulse 67, resp. rate 18,  height 5\' 5"  (1.651 m), weight 59.9 kg (132 lb), SpO2 92 %.  PHYSICAL EXAMINATION:  Physical Exam  GENERAL:  84 y.o.-year-old patient lying in the bed with no acute distress.  EYES: Pupils equal, round, reactive to light and accommodation. No scleral icterus. Extraocular muscles intact.  HEENT: Head atraumatic, normocephalic. Oropharynx and nasopharynx clear. No oropharyngeal erythema, moist oral mucosa  NECK:  Supple, no jugular venous distention. No thyroid enlargement, no tenderness.  LUNGS: Normal breath sounds bilaterally, no wheezing, rales, rhonchi. No use of accessory muscles of respiration.  CARDIOVASCULAR: S1, S2 normal. No murmurs, rubs, or gallops.  ABDOMEN: Soft, nontender, nondistended. Bowel sounds present. No organomegaly or mass.  EXTREMITIES: No pedal edema, cyanosis, or clubbing. + 2 pedal & radial pulses b/l.   NEUROLOGIC: Cranial nerves II through XII are intact. No focal Motor or sensory deficits appreciated b/l PSYCHIATRIC: The patient is alert and oriented x 3. Good affect.  SKIN: No obvious rash, lesion, or ulcer.   LABORATORY  PANEL:   CBC Recent Labs  Lab 11/20/17 1505  WBC 6.6  HGB 13.2  HCT 38.8  PLT 260   ------------------------------------------------------------------------------------------------------------------  Chemistries  Recent Labs  Lab 11/20/17 1505  NA 139  K 3.9  CL 103  CO2 26  GLUCOSE 125*  BUN 18  CREATININE 0.87  CALCIUM 9.6   ------------------------------------------------------------------------------------------------------------------  Cardiac Enzymes No results for input(s): TROPONINI in the last 168 hours. ------------------------------------------------------------------------------------------------------------------  RADIOLOGY:  Dg Chest 1 View  Result Date: 11/20/2017 CLINICAL DATA:  Unwitnessed fall today. RIGHT hip pain. History of hypertension. EXAM: CHEST 1 VIEW COMPARISON:  Chest radiograph June 18, 2017 FINDINGS: The cardiac silhouette is moderately enlarged and unchanged. Status post aortic valve replacement. Mild chronic interstitial changes without pleural effusion or focal consolidation. LEFT midlung zone atelectasis. No pneumothorax. Osteopenia. Acute appearing nondisplaced RIGHT seventh rib fracture with subacute versus old surrounding rib fractures, some of which are new from prior radiograph. IMPRESSION: Stable cardiomegaly and chronic interstitial changes. LEFT midlung zone atelectasis. Acute appearing RIGHT seventh rib fracture, additional subacute chronic appearing RIGHT rib fractures. Electronically Signed   By: Awilda Metroourtnay  Bloomer M.D.   On: 11/20/2017 15:49   Dg Hip Unilat W Or Wo Pelvis 2-3 Views Right  Result Date: 11/20/2017 CLINICAL DATA:  Unwitnessed fall today. Pt c/o right hip pain. Hx of arthritis, HTN. Former smoker. No known previous surgery to right hip. EXAM: DG HIP (WITH OR WITHOUT PELVIS) 2-3V RIGHT COMPARISON:  Pelvic radiograph 08/15/2017 FINDINGS: Oblique fracture through the proximal femur at the level of the inferior trochanter. There is over ride with superior migration of the distal fractured femur and medial displacement. Significant angulation noted on the cross-table lateral with the distal aspect of the proximal fracture fragment approaching askin surface. IMPRESSION: 1. Oblique fracture through the proximal femur at the level of the inferior trochanter. 2. Medial displacement and cephalad migration of the distal fracture fragment. 3. Significant angulation noted on the lateral projection with the proximal fracture fragment approaching the skin surface Electronically Signed   By: Genevive BiStewart  Edmunds M.D.   On: 11/20/2017 15:48   IMPRESSION AND PLAN:   * Right proximal femur fracture Pain meds added N.p.o. after midnight.  Physical therapy after surgery.  Consult orthopedics. No contraindications for surgery.  *Accelerated hypertension.  Likely due to severe hip  pain.  Will add hydralazine IV as needed.  Give home dose of amlodipine and Coreg now.  *Fall.  History of recurrent falls.  Has mild dementia.  Monitor for inpatient delirium.  *DVT prophylaxis.  SCDs.  Post surgery DVT prophylaxis per orthopedics.  All the records are reviewed and case discussed with ED provider. Management plans discussed with the patient, family and they are in agreement.  CODE STATUS: DNR  TOTAL TIME TAKING CARE OF THIS PATIENT: 40 minutes.   Orie FishermanSrikar R Kris No M.D on 11/20/2017 at 5:21 PM  Between 7am to 6pm - Pager - [phone]  After 6pm go to [email or web address] - password [email or web address]  [facility] [place] [facility]  Office  [phone]  CC: Primary care physician; Patient, No Pcp Per  Note: This dictation was prepared with Dragon dictation along with smaller phrase technology. Any transcriptional errors that result from this process are unintentional.

## 2017-11-21 ENCOUNTER — Encounter: Payer: Self-pay | Admitting: Anesthesiology

## 2017-11-21 ENCOUNTER — Encounter
Admission: EM | Disposition: A | Discharge status: Hospice | Payer: Self-pay | Source: Home / Self Care | Attending: Internal Medicine

## 2017-11-21 ENCOUNTER — Inpatient Hospital Stay: Payer: Medicare Other | Admitting: Anesthesiology

## 2017-11-21 ENCOUNTER — Inpatient Hospital Stay: Payer: Medicare Other

## 2017-11-21 HISTORY — PX: INTRAMEDULLARY (IM) NAIL INTERTROCHANTERIC: SHX5875

## 2017-11-21 LAB — BASIC METABOLIC PANEL
Anion gap: 9 (ref 5–15)
BUN: 19 mg/dL (ref 6–20)
CALCIUM: 8.7 mg/dL — AB (ref 8.9–10.3)
CO2: 26 mmol/L (ref 22–32)
Chloride: 101 mmol/L (ref 101–111)
Creatinine, Ser: 0.86 mg/dL (ref 0.44–1.00)
GFR, EST NON AFRICAN AMERICAN: 56 mL/min — AB (ref >60)
Glucose, Bld: 153 mg/dL — ABNORMAL HIGH (ref 65–99)
POTASSIUM: 3.5 mmol/L (ref 3.5–5.1)
SODIUM: 136 mmol/L (ref 135–145)

## 2017-11-21 LAB — CBC
HEMATOCRIT: 30.7 % — AB (ref 35.0–47.0)
HEMOGLOBIN: 10.4 g/dL — AB (ref 12.0–16.0)
MCH: 31.7 pg (ref 26.0–34.0)
MCHC: 33.8 g/dL (ref 32.0–36.0)
MCV: 93.7 fL (ref 80.0–100.0)
Platelets: 218 10*3/uL (ref 150–440)
RBC: 3.27 MIL/uL — ABNORMAL LOW (ref 3.80–5.20)
RDW: 15 % — ABNORMAL HIGH (ref 11.5–14.5)
WBC: 11.2 10*3/uL — AB (ref 3.6–11.0)

## 2017-11-21 LAB — ABO/RH: ABO/RH(D): O NEG

## 2017-11-21 LAB — HEMOGLOBIN: Hemoglobin: 7.8 g/dL — ABNORMAL LOW (ref 12.0–16.0)

## 2017-11-21 SURGERY — FIXATION, FRACTURE, INTERTROCHANTERIC, WITH INTRAMEDULLARY ROD
Anesthesia: General | Laterality: Right

## 2017-11-21 MED ORDER — KETAMINE HCL 50 MG/ML IJ SOLN
INTRAMUSCULAR | Status: AC
  Filled 2017-11-21: qty 10

## 2017-11-21 MED ORDER — METOCLOPRAMIDE HCL 10 MG PO TABS: 5.0000 mg | ORAL_TABLET | Freq: Three times a day (TID) | ORAL | Status: DC | PRN

## 2017-11-21 MED ORDER — BUPIVACAINE HCL (PF) 0.5 % IJ SOLN
INTRAMUSCULAR | Status: DC | PRN
  Administered 2017-11-21: 2.5 mL

## 2017-11-21 MED ORDER — OXYCODONE HCL 5 MG/5ML PO SOLN: 5.0000 mg | Freq: Once | ORAL | Status: DC | PRN

## 2017-11-21 MED ORDER — CLINDAMYCIN PHOSPHATE 600 MG/50ML IV SOLN
600.0000 mg | Freq: Three times a day (TID) | INTRAVENOUS | Status: AC
  Administered 2017-11-22 (×3): 600 mg via INTRAVENOUS
  Filled 2017-11-21 (×3): qty 50

## 2017-11-21 MED ORDER — ZOLPIDEM TARTRATE 5 MG PO TABS: 5.0000 mg | ORAL_TABLET | Freq: Every evening | ORAL | Status: DC | PRN

## 2017-11-21 MED ORDER — FENTANYL CITRATE (PF) 100 MCG/2ML IJ SOLN: 25.0000 ug | INTRAMUSCULAR | Status: DC | PRN

## 2017-11-21 MED ORDER — FENTANYL CITRATE (PF) 100 MCG/2ML IJ SOLN
INTRAMUSCULAR | Status: DC | PRN
  Administered 2017-11-21: 25 ug via INTRAVENOUS

## 2017-11-21 MED ORDER — PHENOL 1.4 % MT LIQD
1.0000 | OROMUCOSAL | Status: DC | PRN
  Filled 2017-11-21: qty 177

## 2017-11-21 MED ORDER — VANCOMYCIN HCL 1000 MG IV SOLR
INTRAVENOUS | Status: AC
  Filled 2017-11-21: qty 1000

## 2017-11-21 MED ORDER — BUPIVACAINE-EPINEPHRINE (PF) 0.25% -1:200000 IJ SOLN
INTRAMUSCULAR | Status: DC | PRN
  Administered 2017-11-21: 30 mL

## 2017-11-21 MED ORDER — PHENYLEPHRINE HCL 10 MG/ML IJ SOLN
INTRAMUSCULAR | Status: AC
  Filled 2017-11-21: qty 1

## 2017-11-21 MED ORDER — EPHEDRINE SULFATE 50 MG/ML IJ SOLN
INTRAMUSCULAR | Status: AC
  Filled 2017-11-21: qty 1

## 2017-11-21 MED ORDER — PROPOFOL 500 MG/50ML IV EMUL
INTRAVENOUS | Status: DC | PRN
  Administered 2017-11-21: 40 ug/kg/min via INTRAVENOUS

## 2017-11-21 MED ORDER — VANCOMYCIN HCL IN DEXTROSE 1-5 GM/200ML-% IV SOLN
1000.0000 mg | Freq: Two times a day (BID) | INTRAVENOUS | Status: DC
  Administered 2017-11-22 (×2): 1000 mg via INTRAVENOUS
  Filled 2017-11-21 (×3): qty 200

## 2017-11-21 MED ORDER — MORPHINE SULFATE (PF) 2 MG/ML IV SOLN: 1.0000 mg | INTRAVENOUS | Status: DC | PRN

## 2017-11-21 MED ORDER — VANCOMYCIN HCL 1000 MG IV SOLR
INTRAVENOUS | Status: DC | PRN
  Administered 2017-11-21: 1000 mg via INTRAVENOUS

## 2017-11-21 MED ORDER — SENNA 8.6 MG PO TABS
1.0000 | ORAL_TABLET | Freq: Two times a day (BID) | ORAL | Status: DC
  Administered 2017-11-22 – 2017-11-23 (×3): 8.6 mg via ORAL
  Filled 2017-11-21 (×4): qty 1

## 2017-11-21 MED ORDER — FERROUS SULFATE 325 (65 FE) MG PO TABS
325.0000 mg | ORAL_TABLET | Freq: Every day | ORAL | Status: DC
  Administered 2017-11-22 – 2017-11-23 (×2): 325 mg via ORAL
  Filled 2017-11-21 (×3): qty 1

## 2017-11-21 MED ORDER — PHENYLEPHRINE HCL 10 MG/ML IJ SOLN
INTRAMUSCULAR | Status: DC | PRN
  Administered 2017-11-21 (×2): 200 ug via INTRAVENOUS
  Administered 2017-11-21: 300 ug via INTRAVENOUS
  Administered 2017-11-21 (×2): 200 ug via INTRAVENOUS
  Administered 2017-11-21: 100 ug via INTRAVENOUS
  Administered 2017-11-21: 200 ug via INTRAVENOUS
  Administered 2017-11-21: 300 ug via INTRAVENOUS
  Administered 2017-11-21 (×3): 200 ug via INTRAVENOUS

## 2017-11-21 MED ORDER — ALUM & MAG HYDROXIDE-SIMETH 200-200-20 MG/5ML PO SUSP: 30.0000 mL | ORAL | Status: DC | PRN

## 2017-11-21 MED ORDER — SODIUM CHLORIDE 0.9 % IV SOLN
Freq: Once | INTRAVENOUS | Status: AC
  Administered 2017-11-21: 21:00:00 via INTRAVENOUS

## 2017-11-21 MED ORDER — FENTANYL CITRATE (PF) 100 MCG/2ML IJ SOLN
INTRAMUSCULAR | Status: AC
  Filled 2017-11-21: qty 2

## 2017-11-21 MED ORDER — LACTATED RINGERS IV SOLN
INTRAVENOUS | Status: DC | PRN
  Administered 2017-11-21: 13:00:00 via INTRAVENOUS

## 2017-11-21 MED ORDER — METOCLOPRAMIDE HCL 5 MG/ML IJ SOLN: 5.0000 mg | Freq: Three times a day (TID) | INTRAMUSCULAR | Status: DC | PRN

## 2017-11-21 MED ORDER — BUPIVACAINE HCL (PF) 0.5 % IJ SOLN
INTRAMUSCULAR | Status: AC
  Filled 2017-11-21: qty 10

## 2017-11-21 MED ORDER — KETAMINE HCL 10 MG/ML IJ SOLN
INTRAMUSCULAR | Status: DC | PRN
  Administered 2017-11-21: 20 mg via INTRAVENOUS
  Administered 2017-11-21: 30 mg via INTRAVENOUS
  Administered 2017-11-21: 20 mg via INTRAVENOUS

## 2017-11-21 MED ORDER — PROPOFOL 500 MG/50ML IV EMUL
INTRAVENOUS | Status: AC
  Filled 2017-11-21: qty 50

## 2017-11-21 MED ORDER — EPHEDRINE SULFATE 50 MG/ML IJ SOLN
INTRAMUSCULAR | Status: DC | PRN
  Administered 2017-11-21 (×3): 25 mg via INTRAVENOUS

## 2017-11-21 MED ORDER — OXYCODONE HCL 5 MG PO TABS: 5.0000 mg | ORAL_TABLET | Freq: Once | ORAL | Status: DC | PRN

## 2017-11-21 MED ORDER — MENTHOL 3 MG MT LOZG
1.0000 | LOZENGE | OROMUCOSAL | Status: DC | PRN
  Filled 2017-11-21: qty 9

## 2017-11-21 MED ORDER — NEOMYCIN-POLYMYXIN B GU 40-200000 IR SOLN
Status: DC | PRN
  Administered 2017-11-21: 4 mL

## 2017-11-21 MED ORDER — MAGNESIUM HYDROXIDE 400 MG/5ML PO SUSP: 30.0000 mL | Freq: Every day | ORAL | Status: DC | PRN

## 2017-11-21 MED ORDER — NEOMYCIN-POLYMYXIN B GU 40-200000 IR SOLN
Status: AC
  Filled 2017-11-21: qty 4

## 2017-11-21 MED ORDER — ENOXAPARIN SODIUM 30 MG/0.3ML ~~LOC~~ SOLN
30.0000 mg | SUBCUTANEOUS | Status: DC
  Administered 2017-11-22 – 2017-11-24 (×3): 30 mg via SUBCUTANEOUS
  Filled 2017-11-21 (×3): qty 0.3

## 2017-11-21 MED ORDER — FLEET ENEMA 7-19 GM/118ML RE ENEM: 1.0000 | ENEMA | Freq: Once | RECTAL | Status: DC | PRN

## 2017-11-21 MED ORDER — SODIUM CHLORIDE 0.45 % IV SOLN
INTRAVENOUS | Status: DC
  Administered 2017-11-21 – 2017-11-23 (×4): via INTRAVENOUS

## 2017-11-21 MED ORDER — BUPIVACAINE-EPINEPHRINE (PF) 0.25% -1:200000 IJ SOLN
INTRAMUSCULAR | Status: AC
  Filled 2017-11-21: qty 30

## 2017-11-21 SURGICAL SUPPLY — 44 items
BIT DRILL 4.0X195MM (BIT) ×1 IMPLANT
BLADE HELICAL 95 (Orthopedic Implant) ×2 IMPLANT
BLADE HELICAL 95MM (Orthopedic Implant) ×1 IMPLANT
BNDG COHESIVE 4X5 TAN STRL (GAUZE/BANDAGES/DRESSINGS) ×3 IMPLANT
CANISTER SUCT 1200ML W/VALVE (MISCELLANEOUS) ×3 IMPLANT
CHLORAPREP W/TINT 26ML (MISCELLANEOUS) ×3 IMPLANT
DRAPE C-ARMOR (DRAPES) ×3 IMPLANT
DRAPE INCISE 23X17 IOBAN STRL (DRAPES) ×2
DRAPE INCISE IOBAN 23X17 STRL (DRAPES) ×1 IMPLANT
DRILL BIT 4.0X195MM (BIT) ×2
DRSG AQUACEL AG ADV 3.5X10 (GAUZE/BANDAGES/DRESSINGS) IMPLANT
DRSG AQUACEL AG ADV 3.5X14 (GAUZE/BANDAGES/DRESSINGS) ×3 IMPLANT
ELECT REM PT RETURN 9FT ADLT (ELECTROSURGICAL) ×3
ELECTRODE REM PT RTRN 9FT ADLT (ELECTROSURGICAL) ×1 IMPLANT
GAUZE PETRO XEROFOAM 1X8 (MISCELLANEOUS) IMPLANT
GAUZE SPONGE 4X4 12PLY STRL (GAUZE/BANDAGES/DRESSINGS) ×3 IMPLANT
GLOVE INDICATOR 8.0 STRL GRN (GLOVE) ×3 IMPLANT
GLOVE SURG ORTHO 8.5 STRL (GLOVE) ×9 IMPLANT
GOWN STRL REUS W/ TWL LRG LVL3 (GOWN DISPOSABLE) ×1 IMPLANT
GOWN STRL REUS W/TWL LRG LVL3 (GOWN DISPOSABLE) ×2
GOWN STRL REUS W/TWL LRG LVL4 (GOWN DISPOSABLE) ×3 IMPLANT
GUIDEWIRE 3.2X400 (WIRE) ×9 IMPLANT
KIT RM TURNOVER STRD PROC AR (KITS) ×3 IMPLANT
MAT BLUE FLOOR 46X72 FLO (MISCELLANEOUS) ×6 IMPLANT
NAIL TROCH 11MMX130X360MM (Nail) ×3 IMPLANT
NEEDLE SPNL 18GX3.5 QUINCKE PK (NEEDLE) ×3 IMPLANT
NS IRRIG 500ML POUR BTL (IV SOLUTION) ×3 IMPLANT
PACK HIP COMPR (MISCELLANEOUS) ×3 IMPLANT
REAMER ROD DEEP FLUTE 2.5X950 (INSTRUMENTS) ×3 IMPLANT
SCREW LOCKING IM 5.0MX52M (Screw) ×3 IMPLANT
SET CABLE W/GRIP LG (MISCELLANEOUS) IMPLANT
SLEEVE CABLE 2MM VT (Orthopedic Implant) ×6 IMPLANT
SPONGE LAP 18X18 5 PK (GAUZE/BANDAGES/DRESSINGS) ×3 IMPLANT
STAPLER SKIN PROX 35W (STAPLE) ×3 IMPLANT
SUCTION FRAZIER HANDLE 10FR (MISCELLANEOUS) ×2
SUCTION TUBE FRAZIER 10FR DISP (MISCELLANEOUS) ×1 IMPLANT
SUT DVC 2 QUILL PDO  T11 36X36 (SUTURE) ×2
SUT DVC 2 QUILL PDO T11 36X36 (SUTURE) ×1 IMPLANT
SUT QUILL PDO 0 36 36 VIOLET (SUTURE) ×3 IMPLANT
SUT QUILL PDO 2 24X24 VLT (SUTURE) ×3 IMPLANT
SUT VIC AB 0 CT1 36 (SUTURE) ×3 IMPLANT
SUT VIC AB 2-0 CT1 27 (SUTURE) ×2
SUT VIC AB 2-0 CT1 TAPERPNT 27 (SUTURE) ×1 IMPLANT
SYR 30ML LL (SYRINGE) ×3 IMPLANT

## 2017-11-21 NOTE — Op Note (Signed)
DATE OF SURGERY:  11/21/2017  TIME: 4:13 PM  PATIENT NAME:  Sheri Webster  AGE: 84 y.o.  PRE-OPERATIVE DIAGNOSIS: Base neck/subtrochanteric  Right femur fracture  POST-OPERATIVE DIAGNOSIS:  SAME  PROCEDURE:  TFN INTRAMEDULLARY (IM) NAIL RIGHT FEMUR WITH 2 DALL-MILES CABLES  SURGEON:  Laketta Soderberg E  ASST:  EBL:  300 cc  COMPLICATIONS:  NONE  OPERATIVE IMPLANTS: Synthes trochanteric femoral nail  11MM/360MM  with interlocking helical blade  95 mm and distal locking screw  52 mm.  2 Dall-Miles cables  PREOPERATIVE INDICATIONS:  Sheri Webster is a 84 y.o. year old who fell and suffered a right base neck/subtrochanteric hip fracture. She was brought into the ER and then admitted and optimized and then elected for surgical intervention.    The risks benefits and alternatives were discussed with the patient and her son including but not limited to the risks of nonoperative treatment, versus surgical intervention including infection, bleeding, nerve injury, malunion, nonunion, hardware prominence, hardware failure, need for hardware removal, blood clots, cardiopulmonary complications, morbidity, mortality, among others, and they were willing to proceed.    OPERATIVE PROCEDURE:  The patient was brought to the operating room and placed in the supine position.  Final anesthesia was administered, with a foley. She was placed on the fracture table.  Closed reduction was performed under C-arm guidance. The length of the femur was also measured using fluoroscopy. Time out was then performed after sterile prep and drape. She received preoperative antibiotics Cleocin and vancomycin  Incision was made from midshaft proximally to the greater trochanter.  Dissection was carried out sharply through subcutaneous tissue and fascia.  Muscle was split bluntly.  The proximal shaft fragments were displaced and had to be manually reduced using bone-holding clamps.  These were reduced was closed anatomically as  possible and held with a [place] claw clamp.  A guidepin was then placed through the tip of the greater trochanter and passed distally.  The large reamer was used to make this opening large enough for now.  A long guidewire was then inserted down the shaft to the distal femur stopping at the old growth plate.  Confirmation was made on AP and lateral views. The above-named nail was opened the femur was then reamed to 14 mm.  The 11 mm / 360 mm right TFN rod was then inserted and position confirmed by fluoroscopy.  The guide pin was then placed through the distal aiming device into the head and neck.  There was a fracture at the base of the neck as well.  This canal was then opened to 95 mm.  95 mm helical blade was then inserted.  Fluoroscopy showed this to be in good position on AP and lateral views.  There did not appear to be any separation of the neck.  This wound was then packed with sponges and distal aiming was carried out.  A small lateral incision was made and dissection carried on down to bone.  The drill hole was made through the distal femur through the large hole in the nail and a 52 mm screw inserted to prevent  rotation and shortening changes changes.    I then secured the proximal interlock.  After confirming the position of the fracture fragments and hardware I then removed the instruments, and took final C-arm pictures AP and lateral the entire length of the leg.  The wounds were irrigated copiously and closed with Quill sutures followed by staples and dry sterile dressing. Sponge and needle count  were correct.   The patient was awakened and returned to PACU in stable and satisfactory condition. There no complications and the patient tolerated the procedure well.  She will be partial weightbearing as tolerated, and will be on Lovenox  For DVT prophylaxis.     Valinda HoarHoward E Levette Paulick, M.D.

## 2017-11-21 NOTE — Anesthesia Post-op Follow-up Note (Signed)
Anesthesia QCDR form completed.        

## 2017-11-21 NOTE — Anesthesia Procedure Notes (Signed)
Spinal  Patient location during procedure: OR Start time: 11/21/2017 1:00 PM End time: 11/21/2017 1:06 PM Staffing Resident/CRNA: Clovis Fredricksonrisson, Llewelyn Sheaffer, CRNA Performed: resident/CRNA  Preanesthetic Checklist Completed: patient identified, site marked, surgical consent, pre-op evaluation, timeout performed, IV checked, risks and benefits discussed and monitors and equipment checked Spinal Block Patient position: sitting Prep: ChloraPrep Patient monitoring: cardiac monitor, heart rate, continuous pulse ox and blood pressure Approach: right paramedian Location: L4-5 Injection technique: single-shot Needle Needle type: Spinocan  Needle gauge: 22 G Needle length: 9 cm Assessment Sensory level: T8 Additional Notes Clear csf, negative heme, negative parathesia.  2.5 ml of 0.5% marcaine injected.

## 2017-11-21 NOTE — Anesthesia Preprocedure Evaluation (Addendum)
Anesthesia Evaluation  Patient identified by MRN, date of birth, ID band Patient awake and Patient confused    Reviewed: Allergy & Precautions, H&P , NPO status , Patient's Chart, lab work & pertinent test results  History of Anesthesia Complications Negative for: history of anesthetic complications  Airway Mallampati: III  TM Distance: <3 FB Neck ROM: limited    Dental  (+) Missing   Pulmonary neg shortness of breath, former smoker,           Cardiovascular Exercise Tolerance: Poor hypertension, + dysrhythmias (A Fib seen on ECG from 2018) Atrial Fibrillation + Valvular Problems/Murmurs      Neuro/Psych PSYCHIATRIC DISORDERS negative neurological ROS     GI/Hepatic negative GI ROS, Neg liver ROS,   Endo/Other  Hypothyroidism   Renal/GU negative Renal ROS  negative genitourinary   Musculoskeletal  (+) Arthritis ,   Abdominal   Peds  Hematology negative hematology ROS (+)   Anesthesia Other Findings Past Medical History: No date: Arthritis No date: H/O prosthetic heart valve No date: Hyperlipidemia No date: Hypertension No date: Hypothyroidism No date: Mild dementia  Past Surgical History: No date: ABDOMINAL HYSTERECTOMY 2004: CARDIAC SURGERY     Comment:  heart valve replacement  BMI    Body Mass Index:  20.67 kg/m      Reproductive/Obstetrics negative OB ROS                           Anesthesia Physical Anesthesia Plan  ASA: III  Anesthesia Plan: Spinal   Post-op Pain Management:    Induction: Intravenous  PONV Risk Score and Plan: Propofol infusion  Airway Management Planned: Natural Airway and Nasal Cannula  Additional Equipment:   Intra-op Plan:   Post-operative Plan:   Informed Consent: I have reviewed the patients History and Physical, chart, labs and discussed the procedure including the risks, benefits and alternatives for the proposed anesthesia with  the patient or authorized representative who has indicated his/her understanding and acceptance.   Dental Advisory Given  Plan Discussed with: Anesthesiologist, CRNA and Surgeon  Anesthesia Plan Comments: (Plan to attempt spinal, patient is somewhat verbally combative so my convert to GA prn  Plan to suspend DNR for procedure.  Patient and family voiced understanding.  Patient and family informed that patient is higher risk for complications from anesthesia during this procedure due to their medical history and age including but not limited to post operative cognitive dysfunction.  They voiced understanding.  Patient and son consented for risks of anesthesia including but not limited to:  - adverse reactions to medications - risk of intubation if required - damage to teeth, lips or other oral mucosa - sore throat or hoarseness - Damage to heart, brain, lungs or loss of life  They voiced understanding.)      Anesthesia Quick Evaluation

## 2017-11-21 NOTE — Clinical Social Work Note (Signed)
Clinical Social Work Assessment  Patient Details  Name: Sheri Webster MRN: [id number] Date of Birth: 19-Sep-1921  Date of referral:  11/21/17               Reason for consult:  Facility Placement                Permission sought to share information with:  Facility Industrial/product designerContact Representative Permission granted to share information::  Yes, Verbal Permission Granted  Name::        Agency::     Relationship::     Contact Information:     Housing/Transportation Living arrangements for the past 2 months:  Assisted Living Facility Source of Information:  Medical Team, Adult Children Patient Interpreter Needed:  None Criminal Activity/Legal Involvement Pertinent to Current Situation/Hospitalization:  No - Comment as needed Significant Relationships:  Adult Children, Community Support Lives with:  Facility Resident Do you feel safe going back to the place where you live?  Yes Need for family participation in patient care:  Yes (Comment)  Care giving concerns:  Patient admitted from ALF   Social Worker assessment / plan:  CSW spoke with the patient's son, Sheri Webster, as the patient was in surgery. CSW introduced self and role in care. Sheri Webster [facility] CSW explained that the patient may need a higher level of care for STR pending PT recommendations. Sheri Webster gave verbal permission to conduct SNF referral proactively should that be the plan post-op.  CSW confirmed PASRR and submitted referral. CSW will provide bed offers once PT has evaluated the patient. CSW is following.  Employment status:  Retired Health and safety inspectornsurance information:  Medicare PT Recommendations:  Not assessed at this time Information / Referral to community resources:  Skilled Nursing Facility  Patient/Family's Response to care: Patient's son is disappointed in the circumstances but thanked the CSW for assistance.  Patient/Family's Understanding of and Emotional Response to Diagnosis, Current  Treatment, and Prognosis:  The patient's family is in agreement with following PT recommendations when provided.  Emotional Assessment Appearance:  Appears stated age Attitude/Demeanor/Rapport:  (Patient in surgery) Affect (typically observed):  (Patient in surgery) Orientation:  Oriented to Self, Oriented to Situation Alcohol / Substance use:  Never Used Psych involvement (Current and /or in the community):  No (Comment)  Discharge Needs  Concerns to be addressed:  Care Coordination, Discharge Planning Concerns Readmission within the last 30 days:  No Current discharge risk:  Chronically ill Barriers to Discharge:  Continued Medical Work up   UAL CorporationKaren M Taryn Shellhammer, LCSW 11/21/2017, 3:48 PM

## 2017-11-21 NOTE — Progress Notes (Signed)
Son at bedside, speaking with Dr Hyacinth MeekerMiller over the telephone about surgery for patient. Anesthesia notified that son is at bedside.

## 2017-11-21 NOTE — H&P (Signed)
THE PATIENT WAS SEEN PRIOR TO SURGERY TODAY.  HISTORY, ALLERGIES, HOME MEDICATIONS AND OPERATIVE PROCEDURE WERE REVIEWED. RISKS AND BENEFITS OF SURGERY DISCUSSED WITH PATIENT AGAIN.  NO CHANGES FROM INITIAL HISTORY AND PHYSICAL NOTED.    

## 2017-11-21 NOTE — Transfer of Care (Signed)
Immediate Anesthesia Transfer of Care Note  Patient: Sheri Webster  Procedure(s) Performed: INTRAMEDULLARY (IM) NAIL INTERTROCHANTRIC (Right )  Patient Location: PACU  Anesthesia Type:Spinal  Level of Consciousness: sedated  Airway & Oxygen Therapy: Patient Spontanous Breathing and Patient connected to face mask oxygen  Post-op Assessment: Report given to RN and Post -op Vital signs reviewed and stable  Post vital signs: Reviewed and stable  Last Vitals:  Vitals:   11/21/17 0520 11/21/17 0731  BP:  (!) 143/73  Pulse:  75  Resp:  18  Temp: 36.4 C 36.6 C  SpO2:  99%    Last Pain:  Vitals:   11/21/17 0731  TempSrc: Oral  PainSc:          Complications: No apparent anesthesia complications

## 2017-11-21 NOTE — Plan of Care (Signed)
  Progressing Pain Managment: General experience of comfort will improve 11/21/2017 1110 - Progressing by Greer Eeampbell, Celest Reitz C, RN   Not Progressing Education: Knowledge of General Education information will improve 11/21/2017 1110 - Not Progressing by Greer Eeampbell, Kermit Arnette C, RN Health Behavior/Discharge Planning: Ability to manage health-related needs will improve 11/21/2017 1110 - Not Progressing by Greer Eeampbell, Robyn Galati C, RN Activity: Risk for activity intolerance will decrease 11/21/2017 1110 - Not Progressing by Greer Eeampbell, Axel Meas C, RN Safety: Ability to remain free from injury will improve 11/21/2017 1110 - Not Progressing by Greer Eeampbell, Carsin Randazzo C, RN

## 2017-11-21 NOTE — Progress Notes (Signed)
15 minute call to floor. 

## 2017-11-21 NOTE — NC FL2 (Signed)
[place] MEDICAID FL2 LEVEL OF CARE SCREENING TOOL     IDENTIFICATION  Patient Name: Sheri Webster Birthdate: August 04, 1921 Sex: female Admission Date (Current Location): 11/20/2017  [place] Number:  Chiropodist and Address:  [facility], [address] [place], [place] [address]      Provider Number: [id number]  Attending Physician Name and Address:  Milagros Loll, MD  Relative Name and Phone Number:  Madell Heino [facility]) [phone]    Current Level of Care: Hospital Recommended Level of Care: Skilled Nursing Facility Prior Approval Number:    Date Approved/Denied:   PASRR Number: [id number]  Discharge Plan: SNF    Current Diagnoses: Patient Active Problem List   Diagnosis Date Noted  . Hip fracture (HCC) 11/20/2017  . Fall   . Dehydration   . Acute midline thoracic back pain   . Palliative care by specialist   . Goals of care, counseling/discussion   . Syncope 06/18/2017    Orientation RESPIRATION BLADDER Height & Weight     Self, Time, Situation  Normal Incontinent Weight: 113 lb (51.3 kg) Height:  5\' 2"  (157.5 cm)  BEHAVIORAL SYMPTOMS/MOOD NEUROLOGICAL BOWEL NUTRITION STATUS      Continent Diet(NPO to be advanced post-op)  AMBULATORY STATUS COMMUNICATION OF NEEDS Skin   Extensive Assist Verbally Surgical wounds                       Personal Care Assistance Level of Assistance  Bathing, Feeding, Dressing Bathing Assistance: Limited assistance Feeding assistance: Independent Dressing Assistance: Limited assistance     Functional Limitations Info  Hearing   Hearing Info: Impaired      SPECIAL CARE FACTORS FREQUENCY  PT (By licensed PT)     PT Frequency: Up to 5/week              Contractures Contractures Info: Not present    Additional Factors Info  Code Status, Allergies, Psychotropic Code Status Info: DNR Allergies Info: Keflex Cephalexin Psychotropic Info: Desyryl          Current Medications (11/21/2017):  This is the current hospital active medication list Current Facility-Administered Medications  Medication Dose Route Frequency Provider Last Rate Last Dose  . 0.9 %  sodium chloride infusion   Intravenous Continuous Deeann Saint, MD   Stopped at 11/21/17 1315  . [MAR Hold] acetaminophen (TYLENOL) tablet 650 mg  650 mg Oral Q6H PRN Milagros Loll, MD   650 mg at 11/20/17 2116   Or  . [MAR Hold] acetaminophen (TYLENOL) suppository 650 mg  650 mg Rectal Q6H PRN Milagros Loll, MD      . Mitzi Hansen Hold] albuterol (PROVENTIL) (2.5 MG/3ML) 0.083% nebulizer solution 2.5 mg  2.5 mg Nebulization Q2H PRN Sudini, Wardell Heath, MD      . Mitzi Hansen Hold] amLODipine (NORVASC) tablet 5 mg  5 mg Oral Daily Milagros Loll, MD   5 mg at 11/20/17 2116  . [MAR Hold] bisacodyl (DULCOLAX) suppository 10 mg  10 mg Rectal Daily PRN Sudini, Wardell Heath, MD      . bupivacaine-epinephrine (MARCAINE W/ EPI) 0.25% -1:200000 injection    PRN Deeann Saint, MD   30 mL at 11/21/17 1315  . [MAR Hold] carvedilol (COREG) tablet 6.25 mg  6.25 mg Oral BID WC Milagros Loll, MD   6.25 mg at 11/21/17 0819  . [MAR Hold] clindamycin (CLEOCIN) IVPB 600 mg  600 mg Intravenous On Call to OR Deeann Saint, MD      . [  MAR Hold] cyclobenzaprine (FLEXERIL) tablet 5 mg  5 mg Oral TID PRN Milagros LollSudini, Srikar, MD      . Mitzi Hansen[MAR Hold] docusate sodium (COLACE) capsule 100 mg  100 mg Oral BID Milagros LollSudini, Srikar, MD   100 mg at 11/20/17 2116  . [MAR Hold] hydrALAZINE (APRESOLINE) injection 10 mg  10 mg Intravenous Q6H PRN Sudini, Wardell HeathSrikar, MD      . Mitzi Hansen[MAR Hold] HYDROcodone-acetaminophen (NORCO/VICODIN) 5-325 MG per tablet 1 tablet  1 tablet Oral Q4H PRN Deeann SaintMiller, Howard, MD      . Mitzi Hansen[MAR Hold] levothyroxine (SYNTHROID, LEVOTHROID) tablet 88 mcg  88 mcg Oral QAC breakfast Sudini, Wardell HeathSrikar, MD      . Mitzi Hansen[MAR Hold] morphine 2 MG/ML injection 1 mg  1 mg Intravenous Q2H PRN Deeann SaintMiller, Howard, MD      . neomycin-polymyxin B (NEOSPORIN) irrigation solution    PRN  Deeann SaintMiller, Howard, MD   4 mL at 11/21/17 1403  . [MAR Hold] ondansetron (ZOFRAN) tablet 4 mg  4 mg Oral Q6H PRN Milagros LollSudini, Srikar, MD       Or  . Mitzi Hansen[MAR Hold] ondansetron (ZOFRAN) injection 4 mg  4 mg Intravenous Q6H PRN Milagros LollSudini, Srikar, MD      . Mitzi Hansen[MAR Hold] oxyCODONE (Oxy IR/ROXICODONE) immediate release tablet 5 mg  5 mg Oral Q4H PRN Sudini, Wardell HeathSrikar, MD      . Mitzi Hansen[MAR Hold] polyethylene glycol (MIRALAX / GLYCOLAX) packet 17 g  17 g Oral Daily PRN Sudini, Wardell HeathSrikar, MD      . Mitzi Hansen[MAR Hold] pravastatin (PRAVACHOL) tablet 40 mg  40 mg Oral Daily Sudini, Srikar, MD      . Mitzi Hansen[MAR Hold] senna (SENOKOT) tablet 8.6 mg  1 tablet Oral BID Milagros LollSudini, Srikar, MD   8.6 mg at 11/20/17 2116  . [MAR Hold] traZODone (DESYREL) tablet 25 mg  25 mg Oral QHS Milagros LollSudini, Srikar, MD   25 mg at 11/20/17 2116   Facility-Administered Medications Ordered in Other Encounters  Medication Dose Route Frequency Provider Last Rate Last Dose  . bupivacaine (MARCAINE) 0.5 % injection   Other Anesthesia Intra-op Clovis Fredricksonrisson, Jennifer, CRNA   2.5 mL at 11/21/17 1306  . ePHEDrine injection   Intravenous Anesthesia Intra-op Clovis Fredricksonrisson, Jennifer, CRNA   25 mg at 11/21/17 1453  . fentaNYL (SUBLIMAZE) injection    Anesthesia Intra-op Clovis Fredricksonrisson, Jennifer, CRNA   25 mcg at 11/21/17 1256  . ketamine (KETALAR) injection    Anesthesia Intra-op Clovis Fredricksonrisson, Jennifer, CRNA   20 mg at 11/21/17 1316  . lactated ringers infusion    Continuous PRN Crisson, Victorino DikeJennifer, CRNA      . phenylephrine (NEO-SYNEPHRINE) injection   Intravenous Anesthesia Intra-op Clovis Fredricksonrisson, Jennifer, CRNA   300 mcg at 11/21/17 1541  . propofol (DIPRIVAN) 500 MG/50ML infusion    Continuous PRN Clovis Fredricksonrisson, Jennifer, CRNA 12.3 mL/hr at 11/21/17 1316 40 mcg/kg/min at 11/21/17 1316  . vancomycin (VANCOCIN) 1,000 mg in sodium chloride 0.9 % 250 mL IVPB   Intravenous Continuous PRN Clovis Fredricksonrisson, Jennifer, CRNA   1,000 mg at 11/21/17 1337     Discharge Medications: Please see discharge summary for a list of discharge  medications.  Relevant Imaging Results:  Relevant Lab Results:   Additional Information SS# [id number]  Judi CongKaren M Gavino Fouch, LCSW

## 2017-11-21 NOTE — Progress Notes (Signed)
[facility] - [place] at [facility]   PATIENT NAME: Sheri Webster    MR#:  [id number]  DATE OF BIRTH:  07/26/1921  SUBJECTIVE:  CHIEF COMPLAINT:   Chief Complaint  Patient presents with  . Hip Pain    Hx Fx Right lower Leg, C/O right hip pain   Continues to have right hip pain.  Pleasantly confused.  Unable to contact family.  REVIEW OF SYSTEMS:    Review of Systems  Unable to perform ROS: Mental acuity    DRUG ALLERGIES:   Allergies  Allergen Reactions  . Keflex [Cephalexin]     VITALS:  Blood pressure (!) 143/73, pulse 75, temperature 97.8 F (36.6 C), temperature source Oral, resp. rate 18, height 5\' 2"  (1.575 m), weight 51.3 kg (113 lb), SpO2 99 %.  PHYSICAL EXAMINATION:   Physical Exam  GENERAL:  84 y.o.-year-old patient lying in the bed with no acute distress.  EYES: Pupils equal, round, reactive to light and accommodation. No scleral icterus. Extraocular muscles intact.  HEENT: Head atraumatic, normocephalic. Oropharynx and nasopharynx Webster.  NECK:  Supple, no jugular venous distention. No thyroid enlargement, no tenderness.  LUNGS: Normal breath sounds bilaterally, no wheezing, rales, rhonchi. No use of accessory muscles of respiration.  CARDIOVASCULAR: S1, S2 normal. No murmurs, rubs, or gallops.  ABDOMEN: Soft, nontender, nondistended. Bowel sounds present. No organomegaly or mass.  EXTREMITIES: No cyanosis, clubbing or edema b/l.    Right lower extremity shortened and externally rotated NEUROLOGIC: Moving all 4 extremities PSYCHIATRIC: The patient is alert and awake.  Pleasantly confused SKIN: No obvious rash, lesion, or ulcer.   LABORATORY PANEL:   CBC Recent Labs  Lab 11/21/17 0317  WBC 11.2*  HGB 10.4*  HCT 30.7*  PLT 218   ------------------------------------------------------------------------------------------------------------------ Chemistries  Recent Labs  Lab 11/21/17 0317  NA 136  K 3.5  CL 101  CO2 26   GLUCOSE 153*  BUN 19  CREATININE 0.86  CALCIUM 8.7*   ------------------------------------------------------------------------------------------------------------------  Cardiac Enzymes No results for input(s): TROPONINI in the last 168 hours. ------------------------------------------------------------------------------------------------------------------  RADIOLOGY:  Dg Chest 1 View  Result Date: 11/20/2017 CLINICAL DATA:  Unwitnessed fall today. RIGHT hip pain. History of hypertension. EXAM: CHEST 1 VIEW COMPARISON:  Chest radiograph June 18, 2017 FINDINGS: The cardiac silhouette is moderately enlarged and unchanged. Status post aortic valve replacement. Mild chronic interstitial changes without pleural effusion or focal consolidation. LEFT midlung zone atelectasis. No pneumothorax. Osteopenia. Acute appearing nondisplaced RIGHT seventh rib fracture with subacute versus old surrounding rib fractures, some of which are new from prior radiograph. IMPRESSION: Stable cardiomegaly and chronic interstitial changes. LEFT midlung zone atelectasis. Acute appearing RIGHT seventh rib fracture, additional subacute chronic appearing RIGHT rib fractures. Electronically Signed   By: Awilda Metroourtnay  Bloomer M.D.   On: 11/20/2017 15:49   Dg Hip Unilat W Or Wo Pelvis 2-3 Views Right  Result Date: 11/20/2017 CLINICAL DATA:  Unwitnessed fall today. Pt c/o right hip pain. Hx of arthritis, HTN. Former smoker. No known previous surgery to right hip. EXAM: DG HIP (WITH OR WITHOUT PELVIS) 2-3V RIGHT COMPARISON:  Pelvic radiograph 08/15/2017 FINDINGS: Oblique fracture through the proximal femur at the level of the inferior trochanter. There is over ride with superior migration of the distal fractured femur and medial displacement. Significant angulation noted on the cross-table lateral with the distal aspect of the proximal fracture fragment approaching askin surface. IMPRESSION: 1. Oblique fracture through the proximal  femur at the level of the inferior trochanter. 2. Medial  displacement and cephalad migration of the distal fracture fragment. 3. Significant angulation noted on the lateral projection with the proximal fracture fragment approaching the skin surface Electronically Signed   By: Genevive Bi M.D.   On: 11/20/2017 15:48     ASSESSMENT AND PLAN:    * Right proximal femur fracture Pain meds PRN N.p.o. for surgery  Physical therapy after surgery.  Consulted orthopedics.  Discussed with Dr. Hyacinth Meeker No contraindications for surgery. Surgery later today  *Acute blood loss anemia likely due to bleeding around fracture site.  No need for transfusion today.  Repeat labs in the morning.  *Accelerated hypertension.  Likely due to severe hip pain.  Restarted patient's amlodipine and Coreg from home.  Improved. IV hydralazine.  *Fall.  History of recurrent falls.  Has mild dementia.  Monitor for inpatient delirium.  *DVT prophylaxis.  SCDs.  Post surgery DVT prophylaxis per orthopedics.  All the records are reviewed and case discussed with Care Management/Social Worker Management plans discussed with the patient, family and they are in agreement.  CODE STATUS: DNR  Patient's CODE STATUS was discussed recently with her son.  After discussing with patient and son not resuscitate orders were placed.  Will continue these wishes.  DVT Prophylaxis: SCDs  TOTAL TIME TAKING CARE OF THIS PATIENT: 35 minutes.   POSSIBLE D/C IN 1-2 DAYS, DEPENDING ON CLINICAL CONDITION.  Orie Fisherman M.D on 11/21/2017 at 9:08 AM  Between 7am to 6pm - Pager - [phone]  After 6pm go to [email or web address] - password [email or web address]  [facility] [place] [facility]  Office  [phone]  CC: Primary care physician; Patient, No Pcp Per  Note: This dictation was prepared with Dragon dictation along with smaller phrase technology. Any transcriptional errors that result from this process are unintentional.

## 2017-11-21 NOTE — Progress Notes (Signed)
Patient's son at bedside. Consent obtained by son.

## 2017-11-21 NOTE — Progress Notes (Signed)
Patient unable to follow commands of RN due to dementia, did receive a spinal and unable to assess Due to patients ongoing medical condition of dementia. Right foot, palpated pedal pulse with dopple.  Warm To touch and pink in color.

## 2017-11-22 LAB — BASIC METABOLIC PANEL
Anion gap: 10 (ref 5–15)
BUN: 30 mg/dL — ABNORMAL HIGH (ref 6–20)
CO2: 22 mmol/L (ref 22–32)
Calcium: 7.8 mg/dL — ABNORMAL LOW (ref 8.9–10.3)
Chloride: 105 mmol/L (ref 101–111)
Creatinine, Ser: 1.16 mg/dL — ABNORMAL HIGH (ref 0.44–1.00)
GFR calc Af Amer: 45 mL/min — ABNORMAL LOW (ref >60)
GFR, EST NON AFRICAN AMERICAN: 39 mL/min — AB (ref >60)
GLUCOSE: 184 mg/dL — AB (ref 65–99)
Potassium: 3.7 mmol/L (ref 3.5–5.1)
Sodium: 137 mmol/L (ref 135–145)

## 2017-11-22 LAB — CBC
HCT: 31 % — ABNORMAL LOW (ref 35.0–47.0)
Hemoglobin: 10.4 g/dL — ABNORMAL LOW (ref 12.0–16.0)
MCH: 30.5 pg (ref 26.0–34.0)
MCHC: 33.6 g/dL (ref 32.0–36.0)
MCV: 90.9 fL (ref 80.0–100.0)
PLATELETS: 180 10*3/uL (ref 150–440)
RBC: 3.42 MIL/uL — ABNORMAL LOW (ref 3.80–5.20)
RDW: 16.8 % — ABNORMAL HIGH (ref 11.5–14.5)
WBC: 13.1 10*3/uL — AB (ref 3.6–11.0)

## 2017-11-22 LAB — URINE CULTURE

## 2017-11-22 LAB — PREPARE RBC (CROSSMATCH)

## 2017-11-22 MED ORDER — DIPHENHYDRAMINE HCL 50 MG/ML IJ SOLN
12.5000 mg | INTRAMUSCULAR | Status: AC
  Administered 2017-11-22: 12.5 mg via INTRAVENOUS
  Filled 2017-11-22: qty 1

## 2017-11-22 NOTE — Progress Notes (Signed)
[facility] - [place] at [facility]   PATIENT NAME: Sheri Webster    MR#:  [id number]  DATE OF BIRTH:  Sep 15, 1921  SUBJECTIVE:  CHIEF COMPLAINT:   Chief Complaint  Patient presents with  . Hip Pain    Hx Fx Right lower Leg, C/O right hip pain   Patient admitted for right hip fracture.  Surgery was done on 11/21/2017.  Pleasantly confused.  Son at bedside. Received 1 unit packed RBC after surgery on 11/21/2017 REVIEW OF SYSTEMS:    Review of Systems  Unable to perform ROS: Mental acuity   DRUG ALLERGIES:   Allergies  Allergen Reactions  . Keflex [Cephalexin]     VITALS:  Blood pressure 116/68, pulse 85, temperature 97.9 F (36.6 C), temperature source Oral, resp. rate 18, height 5\' 2"  (1.575 m), weight 56.2 kg (124 lb), SpO2 99 %.  PHYSICAL EXAMINATION:   Physical Exam  GENERAL:  84 y.o.-year-old patient lying in the bed with no acute distress.  EYES: Pupils equal, round, reactive to light and accommodation. No scleral icterus. Extraocular muscles intact.  HEENT: Head atraumatic, normocephalic. Oropharynx and nasopharynx clear.  NECK:  Supple, no jugular venous distention. No thyroid enlargement, no tenderness.  LUNGS: Normal breath sounds bilaterally, no wheezing, rales, rhonchi. No use of accessory muscles of respiration.  CARDIOVASCULAR: S1, S2 normal. No murmurs, rubs, or gallops.  ABDOMEN: Soft, nontender, nondistended. Bowel sounds present. No organomegaly or mass.  EXTREMITIES: No cyanosis, clubbing or edema b/l.    Right lower extremity shortened and externally rotated NEUROLOGIC: Moving all 4 extremities PSYCHIATRIC: The patient is alert and awake.  Pleasantly confused SKIN: No obvious rash, lesion, or ulcer.   LABORATORY PANEL:   CBC Recent Labs  Lab 11/22/17 0402  WBC 13.1*  HGB 10.4*  HCT 31.0*  PLT 180   ------------------------------------------------------------------------------------------------------------------ Chemistries   Recent Labs  Lab 11/22/17 0402  NA 137  K 3.7  CL 105  CO2 22  GLUCOSE 184*  BUN 30*  CREATININE 1.16*  CALCIUM 7.8*   ------------------------------------------------------------------------------------------------------------------  Cardiac Enzymes No results for input(s): TROPONINI in the last 168 hours. ------------------------------------------------------------------------------------------------------------------  RADIOLOGY:  Dg Chest 1 View  Result Date: 11/20/2017 CLINICAL DATA:  Unwitnessed fall today. RIGHT hip pain. History of hypertension. EXAM: CHEST 1 VIEW COMPARISON:  Chest radiograph June 18, 2017 FINDINGS: The cardiac silhouette is moderately enlarged and unchanged. Status post aortic valve replacement. Mild chronic interstitial changes without pleural effusion or focal consolidation. LEFT midlung zone atelectasis. No pneumothorax. Osteopenia. Acute appearing nondisplaced RIGHT seventh rib fracture with subacute versus old surrounding rib fractures, some of which are new from prior radiograph. IMPRESSION: Stable cardiomegaly and chronic interstitial changes. LEFT midlung zone atelectasis. Acute appearing RIGHT seventh rib fracture, additional subacute chronic appearing RIGHT rib fractures. Electronically Signed   By: Awilda Metroourtnay  Bloomer M.D.   On: 11/20/2017 15:49   Dg Hip Operative Unilat W Or W/o Pelvis Right  Result Date: 11/21/2017 CLINICAL DATA:  Intraoperative images during right hip fracture repair. EXAM: OPERATIVE RIGHT HIP (WITH PELVIS IF PERFORMED) 5 VIEWS TECHNIQUE: Fluoroscopic spot image(s) were submitted for interpretation post-operatively. FLUOROSCOPY TIME:  2 minutes and 54 seconds. COMPARISON:  None. FINDINGS: During the study, a gamma nail and rod are placed across the right hip fracture with 2 proximal cerclage wires and interlocking screw distally. The femoral rod extends into the distal femur. IMPRESSION: Right hip fracture repair as above.  Electronically Signed   By: Gerome Samavid  Williams III M.D   On:  11/21/2017 19:43   Dg Hip Unilat W Or Wo Pelvis 2-3 Views Right  Result Date: 11/20/2017 CLINICAL DATA:  Unwitnessed fall today. Pt c/o right hip pain. Hx of arthritis, HTN. Former smoker. No known previous surgery to right hip. EXAM: DG HIP (WITH OR WITHOUT PELVIS) 2-3V RIGHT COMPARISON:  Pelvic radiograph 08/15/2017 FINDINGS: Oblique fracture through the proximal femur at the level of the inferior trochanter. There is over ride with superior migration of the distal fractured femur and medial displacement. Significant angulation noted on the cross-table lateral with the distal aspect of the proximal fracture fragment approaching askin surface. IMPRESSION: 1. Oblique fracture through the proximal femur at the level of the inferior trochanter. 2. Medial displacement and cephalad migration of the distal fracture fragment. 3. Significant angulation noted on the lateral projection with the proximal fracture fragment approaching the skin surface Electronically Signed   By: Genevive Bi M.D.   On: 11/20/2017 15:48   Dg Femur Port, Min 2 Views Right  Result Date: 11/21/2017 CLINICAL DATA:  Postoperative radiograph of the right femur. Initial encounter. EXAM: RIGHT FEMUR PORTABLE 2 VIEW COMPARISON:  Radiography from yesterday FINDINGS: Subtrochanteric right femur fracture reduction with intramedullary nail, dynamic hip screw, and cerclage wires. A distal interlocking screw is present. No evidence of new fracture. No dislocation. Osteopenia and atherosclerosis. IMPRESSION: Interval ORIF of subtrochanteric right femur fracture. No new abnormality. Electronically Signed   By: Marnee Spring M.D.   On: 11/21/2017 19:44     ASSESSMENT AND PLAN:    * Right proximal femur fracture Status post surgery on 11/21/2017. Pain medications as needed. DVT prophylaxis per orthopedics. PT consult. Discharge to [facility] in 1-2 days.  *Acute blood loss anemia  s/p surgery Received 1 unit packed RBC transfusion yesterday.  Hemoglobin improved to 10.  *Accelerated hypertension.  Likely due to severe hip pain.  Restarted patient's amlodipine and Coreg from home.  Improved. IV hydralazine. Medication held today due to low normal blood pressure.  *Fall.  History of recurrent falls.  Has mild dementia.  Monitor for inpatient delirium.  *DVT prophylaxis.  SCDs.  Post surgery DVT prophylaxis per orthopedics.  All the records are reviewed and case discussed with Care Management/Social Worker Management plans discussed with the patient, family and they are in agreement.  CODE STATUS: DNR  Patient's CODE STATUS was discussed recently with her son.  After discussing with patient and son not resuscitate orders were placed.  Will continue these wishes.  DVT Prophylaxis: SCDs  TOTAL TIME TAKING CARE OF THIS PATIENT: 35 minutes.   POSSIBLE D/C IN 1-2 DAYS, DEPENDING ON CLINICAL CONDITION.  Molinda Bailiff Kile Kabler M.D on 11/22/2017 at 12:16 PM  Between 7am to 6pm - Pager - [phone]  After 6pm go to [email or web address] - password [email or web address]  [facility] [place] [facility]  Office  [phone]  CC: Primary care physician; Patient, No Pcp Per  Note: This dictation was prepared with Dragon dictation along with smaller phrase technology. Any transcriptional errors that result from this process are unintentional.

## 2017-11-22 NOTE — Progress Notes (Signed)
Patient alert but disoriented x4. Aquacell dressing intact with minimal drainage. Edema present in right hip. IV fluids infusing. Rash on trunk of body has improved, itching has improved with benadryl. Patient sleeping between care. Continue to monitor.

## 2017-11-22 NOTE — Evaluation (Signed)
Physical Therapy Evaluation Patient Details Name: Sheri Webster MRN: [id number] DOB: 1921/09/30 Today's Date: 11/22/2017   History of Present Illness  84 y.o. female with a known history of prosthetic heart valve, mild dementia, hypertension, recurrent falls, hypothyroidism, atrial fibrillation not on anticoagulation presents from local nursing home after having a mechanical fall with R hip fx and subsequent IM nailing (11/21/17)  Clinical Impression  Pt very confused, pleasantly at first however with ~10 minutes of P/AAROM with R LE (appart from exam) and with attempts at mobility/sitting she was clearly in a lot of pain and as she was not aware of her current situation (despite PT telling her multiple times) she became agitated and yelling out in pain.  Difficult to get a lot out of her functionally, hoping that pain can be controlled and she will be able to participate better.      Follow Up Recommendations SNF    Equipment Recommendations       Recommendations for Other Services       Precautions / Restrictions Precautions Precautions: Fall Restrictions RLE Weight Bearing: Partial weight bearing      Mobility  Bed Mobility Overal bed mobility: Needs Assistance Bed Mobility: Sit to Supine;Supine to Sit     Supine to sit: Total assist Sit to supine: Total assist   General bed mobility comments: Pt unwilling to show any effort with trying to get to EOB, in fact she was resistant and we did not even get to fully upright sitting as she was pushing back against PT to lay back down   Transfers                 General transfer comment: unable/inappropriate to attempt today  Ambulation/Gait                Stairs            Wheelchair Mobility    Modified Rankin (Stroke Patients Only)       Balance Overall balance assessment: Needs assistance   Sitting balance-Leahy Scale: Zero                                       Pertinent  Vitals/Pain Pain Assessment: (unable to rate, severe pain with any R LE movmeent)    Home Living Family/patient expects to be discharged to:: Skilled nursing facility                      Prior Function Level of Independence: Needs assistance   Gait / Transfers Assistance Needed: per notes pt with very limited, slow, stooped gait with walker           Hand Dominance        Extremity/Trunk Assessment   Upper Extremity Assessment Upper Extremity Assessment: Difficult to assess due to impaired cognition    Lower Extremity Assessment Lower Extremity Assessment: Difficult to assess due to impaired cognition(calling out in pain with any movement of LEs, especially R)       Communication   Communication: (pleasant, but very confused)  Cognition Arousal/Alertness: (very confused) Behavior During Therapy: Anxious Overall Cognitive Status: History of cognitive impairments - at baseline                                 General Comments: Pt unaware of situation, confused and averse  to any movement      General Comments      Exercises General Exercises - Lower Extremity Ankle Circles/Pumps: AAROM;10 reps Short Arc Quad: AAROM;10 reps;PROM Heel Slides: AAROM;PROM;5 reps Hip ABduction/ADduction: AAROM;PROM;10 reps   Assessment/Plan    PT Assessment Patient needs continued PT services  PT Problem List Decreased strength;Decreased range of motion;Decreased activity tolerance;Decreased balance;Decreased mobility;Decreased coordination;Decreased cognition;Decreased knowledge of use of DME;Decreased safety awareness;Decreased knowledge of precautions       PT Treatment Interventions DME instruction;Gait training;Functional mobility training;Therapeutic activities;Therapeutic exercise;Balance training;Neuromuscular re-education;Cognitive remediation;Patient/family education    PT Goals (Current goals can be found in the Care Plan section)  Acute Rehab PT  Goals Patient Stated Goal: unable to state PT Goal Formulation: Patient unable to participate in goal setting Time For Goal Achievement: 12/06/17 Potential to Achieve Goals: Poor    Frequency 7X/week(per ability to participate)   Barriers to discharge        Co-evaluation               AM-PAC PT "6 Clicks" Daily Activity  Outcome Measure Difficulty turning over in bed (including adjusting bedclothes, sheets and blankets)?: Unable Difficulty moving from lying on back to sitting on the side of the bed? : Unable Difficulty sitting down on and standing up from a chair with arms (e.g., wheelchair, bedside commode, etc,.)?: Unable Help needed moving to and from a bed to chair (including a wheelchair)?: Total Help needed walking in hospital room?: Total Help needed climbing 3-5 steps with a railing? : Total 6 Click Score: 6    End of Session   Activity Tolerance: Patient limited by pain(very confused and ultimately agitated with movement) Patient left: with bed alarm set;with call bell/phone within reach Nurse Communication: Mobility status PT Visit Diagnosis: Muscle weakness (generalized) (M62.81);Difficulty in walking, not elsewhere classified (R26.2)    Time: 1610-9604 PT Time Calculation (min) (ACUTE ONLY): 25 min   Charges:   PT Evaluation $PT Eval Low Complexity: 1 Low PT Treatments $Therapeutic Exercise: 8-22 mins   PT G Codes:        Malachi Pro, DPT 11/22/2017, 12:58 PM

## 2017-11-22 NOTE — Progress Notes (Signed)
Patient having trouble swallowing, coughing after sips of water. Dr Elpidio AnisSudini notified, orders placed.

## 2017-11-22 NOTE — Plan of Care (Signed)
  Not Progressing Education: Knowledge of General Education information will improve 11/22/2017 1651 - Not Progressing by Greer Eeampbell, Gustabo Gordillo C, RN Health Behavior/Discharge Planning: Ability to manage health-related needs will improve 11/22/2017 1651 - Not Progressing by Greer Eeampbell, Keasia Dubose C, RN Clinical Measurements: Ability to maintain clinical measurements within normal limits will improve 11/22/2017 1651 - Not Progressing by Greer Eeampbell, Rielle Schlauch C, RN Will remain free from infection 11/22/2017 1651 - Not Progressing by Greer Eeampbell, Dimitris Shanahan C, RN Diagnostic test results will improve 11/22/2017 1651 - Not Progressing by Greer Eeampbell, Aribella Vavra C, RN Respiratory complications will improve 11/22/2017 1651 - Not Progressing by Greer Eeampbell, Zebbie Ace C, RN Cardiovascular complication will be avoided 11/22/2017 1651 - Not Progressing by Greer Eeampbell, Madisen Ludvigsen C, RN Activity: Risk for activity intolerance will decrease 11/22/2017 1651 - Not Progressing by Greer Eeampbell, Danielys Madry C, RN Nutrition: Adequate nutrition will be maintained 11/22/2017 1651 - Not Progressing by Greer Eeampbell, Sanjana Folz C, RN Coping: Level of anxiety will decrease 11/22/2017 1651 - Not Progressing by Greer Eeampbell, Fabiano Ginley C, RN Elimination: Will not experience complications related to bowel motility 11/22/2017 1651 - Not Progressing by Greer Eeampbell, Anaaya Fuster C, RN Will not experience complications related to urinary retention 11/22/2017 1651 - Not Progressing by Greer Eeampbell, Labrandon Knoch C, RN Pain Managment: General experience of comfort will improve 11/22/2017 1651 - Not Progressing by Greer Eeampbell, Miyuki Rzasa C, RN Safety: Ability to remain free from injury will improve 11/22/2017 1651 - Not Progressing by Greer Eeampbell, Timothy Townsel C, RN Skin Integrity: Risk for impaired skin integrity will decrease 11/22/2017 1651 - Not Progressing by Greer Eeampbell, Bobbi Kozakiewicz C, RN  Patient confused and unable to remember or make safe decisions.

## 2017-11-22 NOTE — Progress Notes (Signed)
Subjective: Awake and alert this morning.  Sitting up.  Minimal complaint of pain.  Skin color is better.  Hemoglobin 10.4 after  packed cells transfusion   Procedure(s) (LRB): INTRAMEDULLARY (IM) NAIL INTERTROCHANTRIC (Right)    Patient reports pain as mild.  Objective:   VITALS:   Vitals:   11/22/17 0250 11/22/17 0721  BP: 104/67 116/68  Pulse: 79 85  Resp: 19 18  Temp: 97.6 F (36.4 C) 97.9 F (36.6 C)  SpO2: 100% 99%    Neurologically intact ABD soft Neurovascular intact Sensation intact distally Intact pulses distally Dorsiflexion/Plantar flexion intact Incision: scant drainage  LABS Recent Labs    11/20/17 1505 11/21/17 0317 11/21/17 1736 11/22/17 0402  HGB 13.2 10.4* 7.8* 10.4*  HCT 38.8 30.7*  --  31.0*  WBC 6.6 11.2*  --  13.1*  PLT 260 218  --  180    Recent Labs    11/20/17 1505 11/21/17 0317 11/22/17 0402  NA 139 136 137  K 3.9 3.5 3.7  BUN 18 19 30*  CREATININE 0.87 0.86 1.16*  GLUCOSE 125* 153* 184*    Recent Labs    11/20/17 2028  INR 1.09     Assessment/Plan: 1 Day Post-Op Procedure(s) (LRB): INTRAMEDULLARY (IM) NAIL INTERTROCHANTRIC (Right)   Advance diet Up with therapy Discharge to SNF in 1-2 days.  We will discharge patient on enteric-coated ASA 325 mg twice daily for 6 weeks. Return to clinic 2 weeks for staple removal.

## 2017-11-22 NOTE — Progress Notes (Signed)
Rash noted s/p completion of blood transfusion. MD notified and suspected reaction documented. No new orders at this time. Blood bank notified. Will continue to monitor.

## 2017-11-22 NOTE — Progress Notes (Signed)
Patient started to develop rash, Dr Elpidio AnisSudini notified. Orders to stop vanc, and give benadryl. Orders placed.

## 2017-11-22 NOTE — Progress Notes (Signed)
Son at bedside, states patient is having left hip pain. Dr Elpidio AnisSudini notified, MD will assess patient. Pain meds given.

## 2017-11-23 ENCOUNTER — Ambulatory Visit: Payer: Medicare Other | Admitting: Urology

## 2017-11-23 ENCOUNTER — Inpatient Hospital Stay: Payer: Medicare Other

## 2017-11-23 LAB — BPAM RBC
Blood Product Expiration Date: 201901302359
ISSUE DATE / TIME: 201901192058
Unit Type and Rh: 9500

## 2017-11-23 LAB — TYPE AND SCREEN
ABO/RH(D): O NEG
Antibody Screen: NEGATIVE
UNIT DIVISION: 0

## 2017-11-23 LAB — CBC
HEMATOCRIT: 24.5 % — AB (ref 35.0–47.0)
Hemoglobin: 8.4 g/dL — ABNORMAL LOW (ref 12.0–16.0)
MCH: 31 pg (ref 26.0–34.0)
MCHC: 34.2 g/dL (ref 32.0–36.0)
MCV: 90.8 fL (ref 80.0–100.0)
Platelets: 160 10*3/uL (ref 150–440)
RBC: 2.7 MIL/uL — ABNORMAL LOW (ref 3.80–5.20)
RDW: 16.8 % — AB (ref 11.5–14.5)
WBC: 14 10*3/uL — ABNORMAL HIGH (ref 3.6–11.0)

## 2017-11-23 LAB — BASIC METABOLIC PANEL
ANION GAP: 9 (ref 5–15)
BUN: 44 mg/dL — ABNORMAL HIGH (ref 6–20)
CALCIUM: 7.6 mg/dL — AB (ref 8.9–10.3)
CO2: 23 mmol/L (ref 22–32)
Chloride: 103 mmol/L (ref 101–111)
Creatinine, Ser: 1.54 mg/dL — ABNORMAL HIGH (ref 0.44–1.00)
GFR calc Af Amer: 32 mL/min — ABNORMAL LOW (ref >60)
GFR, EST NON AFRICAN AMERICAN: 28 mL/min — AB (ref >60)
Glucose, Bld: 136 mg/dL — ABNORMAL HIGH (ref 65–99)
Potassium: 3.8 mmol/L (ref 3.5–5.1)
Sodium: 135 mmol/L (ref 135–145)

## 2017-11-23 NOTE — Progress Notes (Signed)
PT Cancellation Note  Patient Details Name: Sheri NormanLarue Sandridge MRN: [id number] DOB: 06-10-1921   Cancelled Treatment:    Reason Eval/Treat Not Completed: Other (comment). Chart reviewed and ordered CT scan result negative for acute changes/infart. Treatment attempted; pt unresponsive with eyes slightly open. Pt does not respond to question/instruction in any way. Family present in room and pt does not respond to them either. Agreed to no further attempted PT today. Re attempt as appropriate tomorrow.    Scot DockHeidi E Barnes, PTA 11/23/2017, 4:04 PM

## 2017-11-23 NOTE — Progress Notes (Signed)
Patient's son Loistine Chancehilip called Visual merchandiserClinical Social Worker (CSW) back. CSW presented bed offers and Loistine Chancehilip chose [facility]. [facility] coordinator at [facility] is aware of accepted bed offer. CSW will continue to follow and assist as needed.   Baker Hughes IncorporatedBailey Drinda Belgard, LCSW [phone]

## 2017-11-23 NOTE — Care Management Important Message (Signed)
Important Message  Patient Details  Name: Sheri Webster MRN: [id number] Date of Birth: [birth year]   Medicare Important Message Given:  Yes Initial note on file.   Collie SiadAngela Malessa Zartman, RN 11/23/2017, 9:25 AM

## 2017-11-23 NOTE — Progress Notes (Signed)
[facility] [place] [facility]   PATIENT NAME: Sheri NormanLarue Bourque    MR#:  [id number]  DATE OF BIRTH:  Sep 26, 1921  SUBJECTIVE:  CHIEF COMPLAINT:   Chief Complaint  Patient presents with  . Hip Pain    Hx Fx Right lower Leg, C/O right hip pain  looks lethargic. REVIEW OF SYSTEMS:    Review of Systems  Unable to perform ROS: Mental acuity   DRUG ALLERGIES:   Allergies  Allergen Reactions  . Keflex [Cephalexin]     VITALS:  Blood pressure 115/62, pulse 83, temperature 99.9 F (37.7 C), temperature source Axillary, resp. rate 17, height 5\' 2"  (1.575 m), weight 59.9 kg (132 lb), SpO2 98 %.  PHYSICAL EXAMINATION:   Physical Exam  GENERAL:  84 y.o.-year-old patient lying in the bed with no acute distress.  EYES: Pupils equal, round, reactive to light and accommodation. No scleral icterus. Extraocular muscles intact.  HEENT: Head atraumatic, normocephalic. Oropharynx and nasopharynx clear.  NECK:  Supple, no jugular venous distention. No thyroid enlargement, no tenderness.  LUNGS: Normal breath sounds bilaterally, no wheezing, rales, rhonchi. No use of accessory muscles of respiration.  CARDIOVASCULAR: S1, S2 normal. No murmurs, rubs, or gallops.  ABDOMEN: Soft, nontender, nondistended. Bowel sounds present. No organomegaly or mass.  EXTREMITIES: No cyanosis, clubbing. Has edema b/l.    Right lower extremity shortened and externally rotated NEUROLOGIC: Moving all 4 extremities PSYCHIATRIC: The patient is sleepy  Pleasantly confused SKIN: No obvious rash, lesion, or ulcer.   LABORATORY PANEL:   CBC Recent Labs  Lab 11/23/17 0359  WBC 14.0*  HGB 8.4*  HCT 24.5*  PLT 160   ------------------------------------------------------------------------------------------------------------------ Chemistries  Recent Labs  Lab 11/23/17 0359  NA 135  K 3.8  CL 103  CO2 23  GLUCOSE 136*  BUN 44*  CREATININE 1.54*  CALCIUM 7.6*    ------------------------------------------------------------------------------------------------------------------  Cardiac Enzymes No results for input(s): TROPONINI in the last 168 hours. ------------------------------------------------------------------------------------------------------------------  RADIOLOGY:  Ct Head Wo Contrast  Result Date: 11/23/2017 CLINICAL DATA:  Altered mental status and flaccid right arm EXAM: CT HEAD WITHOUT CONTRAST TECHNIQUE: Contiguous axial images were obtained from the base of the skull through the vertex without intravenous contrast. COMPARISON:  08/15/2017 FINDINGS: Brain: Diffuse atrophic changes are identified. Chronic white matter ischemic change is noted similar to that seen on prior exam. No findings to suggest acute hemorrhage, acute infarction or space-occupying mass lesion are noted. Vascular: No hyperdense vessel or unexpected calcification. Skull: Normal. Negative for fracture or focal lesion. Sinuses/Orbits: No acute finding. Postsurgical changes are noted in the globes bilaterally. Other: None. IMPRESSION: Chronic atrophic changes and white matter ischemic change. No acute abnormality noted. Electronically Signed   By: Alcide CleverMark  Lukens M.D.   On: 11/23/2017 15:51   Dg Femur Port, Min 2 Views Right  Result Date: 11/21/2017 CLINICAL DATA:  Postoperative radiograph of the right femur. Initial encounter. EXAM: RIGHT FEMUR PORTABLE 2 VIEW COMPARISON:  Radiography from yesterday FINDINGS: Subtrochanteric right femur fracture reduction with intramedullary nail, dynamic hip screw, and cerclage wires. A distal interlocking screw is present. No evidence of new fracture. No dislocation. Osteopenia and atherosclerosis. IMPRESSION: Interval ORIF of subtrochanteric right femur fracture. No new abnormality. Electronically Signed   By: Marnee SpringJonathon  Watts M.D.   On: 11/21/2017 19:44     ASSESSMENT AND PLAN:    * Right proximal femur fracture Status post surgery  on 11/21/2017. Pain medications as needed. To [facility] tomorrow  * Rt UE weakness/flaccid  state: reported per nursing - will get STAT CT head to r/o acute CVA  *Acute blood loss anemia s/p surgery Received 1 unit packed RBC transfusion. Hemoglobin 8.4  *Accelerated hypertension.  Likely due to severe hip pain.  - continue amlodipine and Coreg from home.  Improved.  *Fall.  History of recurrent falls.  Has mild dementia.  Monitor for inpatient delirium.  *DVT prophylaxis.  SCDs.  Post surgery DVT prophylaxis per orthopedics.   [facility] with Palliative care - seems appropriate for Hospice   All the records are reviewed and case discussed with Care Management/Social Worker Management plans discussed with the patient, family (left message to son) and they are in agreement.  CODE STATUS: DNR  DVT Prophylaxis: SCDs  TOTAL TIME TAKING CARE OF THIS PATIENT: 35 minutes.   POSSIBLE D/C IN 1 DAYS, DEPENDING ON CLINICAL CONDITION.  Delfino Lovett M.D on 11/23/2017 at 4:49 PM  Between 7am to 6pm - Pager - [phone]  After 6pm go to [email or web address] - password [email or web address]  [facility]  Office  [phone]  CC: Primary care physician; Patient, No Pcp Per  Note: This dictation was prepared with Dragon dictation along with smaller phrase technology. Any transcriptional errors that result from this process are unintentional.

## 2017-11-23 NOTE — Clinical Social Work Placement (Signed)
   CLINICAL SOCIAL WORK PLACEMENT  NOTE  Date:  11/23/2017  Patient Details  Name: Sheri Webster MRN: [id number] Date of Birth: 03/07/1921  Clinical Social Work is seeking post-discharge placement for this patient at the Skilled  Nursing Facility level of care (*CSW will initial, date and re-position this form in  chart as items are completed):  Yes   Patient/family provided with [facility] Clinical Social Work Department's list of facilities offering this level of care within the geographic area requested by the patient (or if unable, by the patient's family).  Yes   Patient/family informed of their freedom to choose among providers that offer the needed level of care, that participate in Medicare, Medicaid or managed care program needed by the patient, have an available bed and are willing to accept the patient.  Yes   Patient/family informed of [place]'s ownership interest in [facility] and [facility], as well as of the fact that they are under no obligation to receive care at these facilities.  PASRR submitted to EDS on       PASRR number received on       Existing PASRR number confirmed on 11/21/17     FL2 transmitted to all facilities in geographic area requested by pt/family on 11/21/17     FL2 transmitted to all facilities within larger geographic area on       Patient informed that his/her managed care company has contracts with or will negotiate with certain facilities, including the following:            Patient/family informed of bed offers received.  Patient chooses bed at       Physician recommends and patient chooses bed at      Patient to be transferred to   on  .  Patient to be transferred to facility by       Patient family notified on   of transfer.  Name of family member notified:        PHYSICIAN Please sign DNR     Additional Comment:    _______________________________________________ Keedan , Darleen CrockerBailey M, LCSW 11/23/2017, 2:43 PM

## 2017-11-23 NOTE — Evaluation (Addendum)
Occupational Therapy Evaluation Patient Details Name: Sheri Webster MRN: [id number] DOB: 1921/08/13 Today's Date: 11/23/2017    History of Present Illness 84 y.o. female with a known history of prosthetic heart valve, mild dementia, hypertension, recurrent falls, hypothyroidism, atrial fibrillation not on anticoagulation presents from local nursing home after having a mechanical fall with R hip fx and subsequent IM nailing (11/21/17)   Clinical Impression   Pt seen for OT evaluation this date. No family members present in room. Pt leaning slightly to R laterally with head against bed rail upon arrival requiring max assist to adjust with pillows to support improved posture in bed. Pt disoriented, unable to follow commands, yells out in pain with any movement to RLE. Pt noted to have large loose BM. Pt required total assist x2 for toileting at bed level (total x1 for rolling side to side, total x1 for hygiene). Bandage noted to be bloody, RN notified. Pt's RUE noted to be edematous and no muscle activation noted, does respond to deep pressure/noxious stimuli on RUE. Warm to touch. Nurse tech notified, spoke with RN who stated she would inform the MD. Pt would benefit from skilled OT services to address noted impairments and functional deficits in all aspects of mobility and ADL in order to maximize return to PLOF and minimize risk of functional decline, increased caregiver burden/level of care, and minimize future falls. Recommend transition to STR when medically appropriate.     Follow Up Recommendations  SNF    Equipment Recommendations  3 in 1 bedside commode    Recommendations for Other Services       Precautions / Restrictions Precautions Precautions: Fall Restrictions Weight Bearing Restrictions: Yes RLE Weight Bearing: Partial weight bearing      Mobility Bed Mobility Overal bed mobility: Needs Assistance Bed Mobility: Rolling Rolling: Total assist         General bed  mobility comments: total assist for rolling side to side in bed during toileting tasks, pt moaning with pain in R hip  Transfers                 General transfer comment: unable/inappropriate to attempt today    Balance Overall balance assessment: Needs assistance   Sitting balance-Leahy Scale: Zero   Postural control: Right lateral lean(pt leaning slightly to R with head against bed rail upon OT's entry into room, requiring total assist to correct and pillows to maintain position)                                 ADL either performed or assessed with clinical judgement   ADL Overall ADL's : Needs assistance/impaired Eating/Feeding: Maximal assistance   Grooming: Maximal assistance   Upper Body Bathing: Bed level;Total assistance   Lower Body Bathing: Bed level;Total assistance   Upper Body Dressing : Bed level;Total assistance   Lower Body Dressing: Bed level;Total assistance     Toilet Transfer Details (indicate cue type and reason): unsafe to attempt Toileting- Clothing Manipulation and Hygiene: Total assistance;Bed level;+2 for physical assistance Toileting - Clothing Manipulation Details (indicate cue type and reason): total assist +1 for hygiene after large loose BM and total assist +1 for rolling to facilitate hygiene              Vision Baseline Vision/History: (difficult to assess, pt not able to state) Patient Visual Report: (pt unable to state) Additional Comments: unable to assess due to impaired cognition  Perception     Praxis      Pertinent Vitals/Pain Pain Assessment: Faces Faces Pain Scale: Hurts worst Pain Location: R hip with rolling for toileting hygiene  Pain Descriptors / Indicators: Moaning;Grimacing;Operative site guarding Pain Intervention(s): Limited activity within patient's tolerance;Monitored during session;Repositioned     Hand Dominance     Extremity/Trunk Assessment Upper Extremity Assessment Upper  Extremity Assessment: Difficult to assess due to impaired cognition;RUE deficits/detail RUE Deficits / Details: RUE noted to be edematous, pt responses to noxious stimuli but no muscle activation in RUE despite max encouragement, notified RN and nurse tech, per notes pt was moving RUE previous date   Lower Extremity Assessment Lower Extremity Assessment: Defer to PT evaluation;Difficult to assess due to impaired cognition(pt yells out in pain with any RLE movement)       Communication Communication Communication: Other (comment)(dementia, difficult to assess communication as pt does not respond to simple questions)   Cognition Arousal/Alertness: Lethargic(won't open eyes, does not assist in bed mobility) Behavior During Therapy: WFL for tasks assessed/performed Overall Cognitive Status: History of cognitive impairments - at baseline                                 General Comments: pt unable to follow commands, yells out in pain with any movement on RLE, won't open eyes   General Comments  Bandage near R knee saturated with blood, nurse tech aware, notified RN to assist with cleaning/dressing in preparation for donning new thigh high TED hose to RLE    Exercises     Shoulder Instructions      Home Living Family/patient expects to be discharged to:: Skilled nursing facility                                 Additional Comments: per chart review, pt from [facility]      Prior Functioning/Environment Level of Independence: Needs assistance  Gait / Transfers Assistance Needed: per notes pt with very limited, slow, stooped gait with walker ADL's / Homemaking Assistance Needed: pt unable to provide detail, no family present, will attempt to obtain as able            OT Problem List: Decreased strength;Decreased cognition;Decreased activity tolerance;Impaired UE functional use;Pain;Decreased safety awareness;Impaired balance (sitting and/or  standing);Impaired sensation;Increased edema      OT Treatment/Interventions: Self-care/ADL training;Balance training;Therapeutic exercise;Therapeutic activities;DME and/or AE instruction;Patient/family education    OT Goals(Current goals can be found in the care plan section) Acute Rehab OT Goals Patient Stated Goal: unable to state OT Goal Formulation: Patient unable to participate in goal setting  OT Frequency: Min 1X/week   Barriers to D/C:            Co-evaluation              AM-PAC PT "6 Clicks" Daily Activity     Outcome Measure Help from another person eating meals?: A Lot Help from another person taking care of personal grooming?: A Lot Help from another person toileting, which includes using toliet, bedpan, or urinal?: Total Help from another person bathing (including washing, rinsing, drying)?: Total Help from another person to put on and taking off regular upper body clothing?: Total Help from another person to put on and taking off regular lower body clothing?: Total 6 Click Score: 8   End of Session Nurse Communication: Other (comment)(RUE  edema, flacid, R lat lean)  Activity Tolerance: Patient limited by pain;Other (comment)(limited by cognition) Patient left: in bed;with call bell/phone within reach;with nursing/sitter in room  OT Visit Diagnosis: Other abnormalities of gait and mobility (R26.89);Muscle weakness (generalized) (M62.81);History of falling (Z91.81);Pain Pain - Right/Left: Right Pain - part of body: Hip                Time: 1330-1356 OT Time Calculation (min): 26 min Charges:  OT General Charges $OT Visit: 1 Visit OT Evaluation $OT Eval Low Complexity: 1 Low OT Treatments $Self Care/Home Management : 8-22 mins   Richrd PrimeJamie Stiller, MPH, MS, OTR/L ascom [phone] 11/23/17, 2:40 PM

## 2017-11-23 NOTE — Progress Notes (Signed)
OT Cancellation Note  Patient Details Name: Sheri Webster MRN: [id number] DOB: 1921-04-25   Cancelled Treatment:    Reason Eval/Treat Not Completed: Patient at procedure or test/ unavailable. Order received, chart reviewed. Pt being evaluated by SLP upon attempt. Will re-attempt OT evaluation at later date/time as pt is available.  Richrd PrimeJamie Stiller, MPH, MS, OTR/L ascom [phone] 11/23/17, 9:17 AM

## 2017-11-23 NOTE — Progress Notes (Signed)
Subjective: Less alert today.  Arousable.  Does not appear to be in serious pain.  2 Days Post-Op Procedure(s) (LRB): INTRAMEDULLARY (IM) NAIL INTERTROCHANTRIC (Right)    Patient reports pain as mild.  Objective:   VITALS:   Vitals:   11/23/17 0453 11/23/17 0739  BP: 123/62 115/62  Pulse: 87 83  Resp: 17   Temp: 99.9 F (37.7 C) 99.9 F (37.7 C)  SpO2:  98%    Neurologically intact ABD soft Neurovascular intact Sensation intact distally Intact pulses distally Dorsiflexion/Plantar flexion intact Incision: scant drainage and moderate drainage  LABS Recent Labs    11/21/17 0317 11/21/17 1736 11/22/17 0402 11/23/17 0359  HGB 10.4* 7.8* 10.4* 8.4*  HCT 30.7*  --  31.0* 24.5*  WBC 11.2*  --  13.1* 14.0*  PLT 218  --  180 160    Recent Labs    11/21/17 0317 11/22/17 0402 11/23/17 0359  NA 136 137 135  K 3.5 3.7 3.8  BUN 19 30* 44*  CREATININE 0.86 1.16* 1.54*  GLUCOSE 153* 184* 136*    Recent Labs    11/20/17 2028  INR 1.09     Assessment/Plan: 2 Days Post-Op Procedure(s) (LRB): INTRAMEDULLARY (IM) NAIL INTERTROCHANTRIC (Right)   Advance diet Up with therapy Discharge to SNF   DVT prophylaxis: Enteric-coated aspirin 325 mg twice daily for 6 weeks Return to clinic 2 weeks for staple removal Remain partial weightbearing

## 2017-11-23 NOTE — Anesthesia Postprocedure Evaluation (Signed)
Anesthesia Post Note  Patient: Sheri Webster  Procedure(s) Performed: INTRAMEDULLARY (IM) NAIL INTERTROCHANTRIC (Right )  Patient location during evaluation: PACU Anesthesia Type: General Level of consciousness: oriented and awake and alert Pain management: pain level controlled Vital Signs Assessment: post-procedure vital signs reviewed and stable Respiratory status: spontaneous breathing, respiratory function stable and patient connected to nasal cannula oxygen Cardiovascular status: blood pressure returned to baseline and stable Postop Assessment: no headache, no backache, no apparent nausea or vomiting and spinal receding Anesthetic complications: no     Last Vitals:  Vitals:   11/23/17 0453 11/23/17 0739  BP: 123/62 115/62  Pulse: 87 83  Resp: 17   Temp: 37.7 C 37.7 C  SpO2:  98%    Last Pain:  Vitals:   11/23/17 1314  TempSrc:   PainSc: Asleep                 Cleda MccreedyJoseph K Piscitello

## 2017-11-23 NOTE — Care Management (Signed)
Met with patient's son Arnette Norris while delivering original IM. He states she is from [facility] and at baseline walks with a two-wheeled walker.  He agrees with SNF if needed.

## 2017-11-23 NOTE — Progress Notes (Signed)
Notified by OT that patient RUE was swollen and flaccid. Unsure to whether or not patient's arm was like this yesterday. MD notified, orders placed for head CT, will continue to assess and monitor.

## 2017-11-23 NOTE — Evaluation (Signed)
Clinical/Bedside Swallow Evaluation Patient Details  Name: Sheri Webster MRN: [id number] Date of Birth: 1921/07/14  Today's Date: 11/23/2017 Time: SLP Start Time (ACUTE ONLY): 0840 SLP Stop Time (ACUTE ONLY): 0940 SLP Time Calculation (min) (ACUTE ONLY): 60 min  Past Medical History:  Past Medical History:  Diagnosis Date  . Arthritis   . H/O prosthetic heart valve   . Hyperlipidemia   . Hypertension   . Hypothyroidism   . Mild dementia    Past Surgical History:  Past Surgical History:  Procedure Laterality Date  . ABDOMINAL HYSTERECTOMY    . CARDIAC SURGERY  2004   heart valve replacement   HPI:  Pt is a 84 y.o. female with a known history of prosthetic heart valve, Dementia, hypertension, recurrent falls, hypothyroidism, atrial fibrillation not on anticoagulation presents to the emergency room from local nursing home after having a mechanical fall with right hip pain.  Patient has a proximal femur fracture with significant angulation.  Orthopedics consulted.  Surgical intervention completed on 11/21/17.  Pt requires max cues for follow through w/ tasks; perseverations noted - suspect impact from Dementia(exacerbated now?). NSG reported pt was coughing w/ thin liquids; diet has been modified by MD.   Assessment / Plan / Recommendation Clinical Impression  Pt appears to present w/ moderate oropharyngeal phase dysphagia impacted by moderate-severe (presenting) Dementia/Cognitive decline. Pt is at increased risk for aspiration(dysphagia) as well as at risk for inadequate nutrition/hydration intake. Pt consumed po trials of Nectar liquids via cup/straw w/ no immediate overt s/s of aspiration noted; delayed cough and wet vocal quality noted when pt responded to Son talking b/f she had completely swallowed and cleared - pt was easily distracted by Son talking to her during eating/drinking. The coughing and wet vocal quality did not increase in frequency or intensity when pt was encouraged  notto talk during po trials. Similar was noted w/ puree trials. Noted pt's respiratory status was mildly declined upon entering room w/ min effort and crackling during exhalations. During po trials, pt was given rest breaks to avoid any increased WOB/SOB. Pt required full feeding assistance w/ po's; reduced distractions. Pt does present w/ increased risk for aspiration/dysphagia. Recommend continue a dysphagia diet of Dysphagia level 1 w/ Nectar liquids; strict aspiration precautions. Feeding assistance and rest breaks. Pills in Puree - Crushed as able. ST services will f/u w/ toleration of diet; recommend Dietician f/u for nutritional supplements.  SLP Visit Diagnosis: Dysphagia, oropharyngeal phase (R13.12)    Aspiration Risk  Moderate aspiration risk;Risk for inadequate nutrition/hydration    Diet Recommendation  Dysphagia level 1(puree) w/ NECTAR consistency liquids - Strict aspiration precautions; feeding support and rest breaks to avoid WOB/SOB. Reduce distractions during meals.  Medication Administration: Crushed with puree    Other  Recommendations Recommended Consults: (Dietician; Palliative Care f/u) Oral Care Recommendations: Oral care BID;Staff/trained caregiver to provide oral care Other Recommendations: Order thickener from pharmacy;Prohibited food (jello, ice cream, thin soups);Remove water pitcher;Have oral suction available   Follow up Recommendations Skilled Nursing facility      Frequency and Duration min 3x week  2 weeks       Prognosis Prognosis for Safe Diet Advancement: Fair Barriers to Reach Goals: Cognitive deficits;Severity of deficits;Behavior      Swallow Study   General Date of Onset: 11/20/17 HPI: Pt is a 84 y.o. female with a known history of prosthetic heart valve, Dementia, hypertension, recurrent falls, hypothyroidism, atrial fibrillation not on anticoagulation presents to the emergency room from local nursing home after  having a mechanical fall with  right hip pain.  Patient has a proximal femur fracture with significant angulation.  Orthopedics consulted.  Surgical intervention completed on 11/21/17.  Pt requires max cues for follow through w/ tasks; perseverations noted - suspect impact from Dementia(exacerbated now?). NSG reported pt was coughing w/ thin liquids; diet has been modified by MD. Type of Study: Bedside Swallow Evaluation Previous Swallow Assessment: none reported Diet Prior to this Study: Dysphagia 1 (puree);Nectar-thick liquids Temperature Spikes Noted: (temp 99.9; wbc 14.0) Respiratory Status: Nasal cannula(2 liters) History of Recent Intubation: No Behavior/Cognition: Alert;Cooperative;Confused;Distractible;Requires cueing Oral Cavity Assessment: Within Functional Limits Oral Care Completed by SLP: Recent completion by staff Oral Cavity - Dentition: Edentulous Vision: (n/a) Self-Feeding Abilities: Total assist Patient Positioning: Upright in bed Baseline Vocal Quality: Normal Volitional Cough: Cognitively unable to elicit Volitional Swallow: Unable to elicit    Oral/Motor/Sensory Function Overall Oral Motor/Sensory Function: (appeared grossly wfl w/ bolus management/ROM)   Ice Chips Ice chips: Not tested   Thin Liquid Thin Liquid: Not tested Other Comments: d/t severe confusion    Nectar Thick Nectar Thick Liquid: Impaired Presentation: Cup;Straw(4 trials each) Oral Phase Impairments: Poor awareness of bolus(required verbal cues to attend; slower movements ) Oral phase functional implications: Prolonged oral transit Pharyngeal Phase Impairments: Multiple swallows;Cough - Delayed;Wet Vocal Quality(x2 - did not increase w/ following trials) Other Comments: pt had been drinking water w/ Son in room prior as he stated he was "trying to get her to take something".    Honey Thick Honey Thick Liquid: Not tested   Puree Puree: Impaired Presentation: Spoon(fed; 6 trials) Oral Phase Impairments: Reduced lingual  movement/coordination;Poor awareness of bolus(slower movements overall) Oral Phase Functional Implications: Prolonged oral transit Pharyngeal Phase Impairments: Multiple swallows;Cough - Delayed;Wet Vocal Quality(x1 - did not appear to increase w/ trials )   Solid   GO   Solid: Not tested         Jerilynn SomKatherine Beza Steppe, MS, CCC-SLP Naje Rice 11/23/2017,10:02 AM

## 2017-11-23 NOTE — Progress Notes (Signed)
Clinical Social Worker (CSW) attempted to contact patient's son Loistine Chancehilip several times today to present SNF bed offers however Loistine Chancehilip did not answer. No family was at patient's bedside this afternoon and patient is not alert and oriented. CSW will continue to follow and assist as needed.   Baker Hughes IncorporatedBailey Shequita Peplinski, LCSW [phone]

## 2017-11-24 ENCOUNTER — Encounter: Payer: Self-pay | Admitting: Specialist

## 2017-11-24 DIAGNOSIS — R4182 Altered mental status, unspecified: Secondary | ICD-10-CM

## 2017-11-24 DIAGNOSIS — S72001A Fracture of unspecified part of neck of right femur, initial encounter for closed fracture: Secondary | ICD-10-CM

## 2017-11-24 DIAGNOSIS — Z515 Encounter for palliative care: Secondary | ICD-10-CM

## 2017-11-24 DIAGNOSIS — Z9189 Other specified personal risk factors, not elsewhere classified: Secondary | ICD-10-CM

## 2017-11-24 DIAGNOSIS — R627 Adult failure to thrive: Secondary | ICD-10-CM

## 2017-11-24 LAB — BASIC METABOLIC PANEL
ANION GAP: 10 (ref 5–15)
BUN: 38 mg/dL — AB (ref 6–20)
CHLORIDE: 104 mmol/L (ref 101–111)
CO2: 21 mmol/L — AB (ref 22–32)
Calcium: 7.7 mg/dL — ABNORMAL LOW (ref 8.9–10.3)
Creatinine, Ser: 1.09 mg/dL — ABNORMAL HIGH (ref 0.44–1.00)
GFR calc Af Amer: 49 mL/min — ABNORMAL LOW (ref >60)
GFR calc non Af Amer: 42 mL/min — ABNORMAL LOW (ref >60)
GLUCOSE: 134 mg/dL — AB (ref 65–99)
POTASSIUM: 3.4 mmol/L — AB (ref 3.5–5.1)
Sodium: 135 mmol/L (ref 135–145)

## 2017-11-24 LAB — CBC
HCT: 21.3 % — ABNORMAL LOW (ref 35.0–47.0)
HEMOGLOBIN: 7.3 g/dL — AB (ref 12.0–16.0)
MCH: 30.8 pg (ref 26.0–34.0)
MCHC: 34.1 g/dL (ref 32.0–36.0)
MCV: 90.5 fL (ref 80.0–100.0)
Platelets: 171 10*3/uL (ref 150–440)
RBC: 2.36 MIL/uL — ABNORMAL LOW (ref 3.80–5.20)
RDW: 16.8 % — ABNORMAL HIGH (ref 11.5–14.5)
WBC: 12 10*3/uL — ABNORMAL HIGH (ref 3.6–11.0)

## 2017-11-24 LAB — PREPARE RBC (CROSSMATCH)

## 2017-11-24 MED ORDER — SENNA 8.6 MG PO TABS: 1.0000 | ORAL_TABLET | Freq: Two times a day (BID) | ORAL | Status: DC | PRN

## 2017-11-24 MED ORDER — ASPIRIN EC 325 MG PO TBEC: 325.0000 mg | DELAYED_RELEASE_TABLET | Freq: Two times a day (BID) | ORAL | Status: DC

## 2017-11-24 MED ORDER — SODIUM CHLORIDE 0.9 % IV SOLN
Freq: Once | INTRAVENOUS | Status: AC
  Administered 2017-11-24: 18:00:00 via INTRAVENOUS

## 2017-11-24 MED ORDER — MORPHINE SULFATE (CONCENTRATE) 10 MG/0.5ML PO SOLN: 5.0000 mg | ORAL | Status: DC | PRN

## 2017-11-24 MED ORDER — GLYCOPYRROLATE 0.2 MG/ML IJ SOLN
0.2000 mg | INTRAMUSCULAR | Status: DC | PRN
  Filled 2017-11-24: qty 1

## 2017-11-24 MED ORDER — LORAZEPAM 2 MG/ML IJ SOLN: 0.5000 mg | INTRAMUSCULAR | Status: DC | PRN

## 2017-11-24 MED ORDER — MORPHINE SULFATE (PF) 2 MG/ML IV SOLN: 1.0000 mg | INTRAVENOUS | Status: DC | PRN

## 2017-11-24 NOTE — Progress Notes (Signed)
New hospice home referral received from [facility] following a  Palliative Medicine consult. Patient is a 84 year old woman with a past medical history of mild dementia, hypothyroidism, hypertension, hyperlipidemia, prosthetic heart valve, and arthritis, admitted from [facility] following a mechanical fall. In the ED she was found to have a proximal right hip fracture. Repair done on 1/19. Patient has remained lethargic, not eating or drinking. Palliative Medicine was consulted for goals of acre and met with her son Doren Custard this morning. He has chosen to focus on his mother's comfort and wishes for her to discharge to the hospice home.  Writer spoke on the phone with Doren Custard [phone]) to initiate education regarding hospice services, philosophy and team approach to care with understanding voiced. Plan is for discharge to the hospice home tomorrow 1/23, Doren Custard aware. Patient seen lying in bed, appeared pale, no response to voice or gentle touch, daughter in law  present in the room emotional support provided. Hospital care team updated. Patient information faxed to referral. Will follow through final disposition.  Flo Shanks RN, BSN, [place] [facility] [place], [facility] [phone]

## 2017-11-24 NOTE — Consult Note (Signed)
Consultation Note Date: 11/24/2017   Patient Name: Sheri Webster  DOB: [birth year]  MRN: [id number]  Age / Sex: 84 y.o., female  PCP: Patient, No Pcp Per Referring Physician: Max Sane, MD  Reason for Consultation: Establishing goals of care  HPI/Patient Profile: 84 y.o. female  with past medical history of mild dementia, hypothyroidism, hypertension, hyperlipidemia, prosthetic heart valve, and arthritis admitted on 11/20/2017 with right hip pain after fall. Found to have proximal hip fracture s/p right intramedullary nailing on 1/19. Patient has continued to be minimally responsive with poor functional, nutritional, and cognitive status. Stat CT head performed 1/21 due to RUE being flaccid. Negative for acute findings. Guarded prognosis. Palliative medicine consultation for goals of care/terminal care.   Clinical Assessment and Goals of Care: I have reviewed medical records, discussed with care team and met with patient and son Sheri Webster) at bedside to discuss diagnosis, prognosis, GOC, EOL wishes, disposition and options. Patient wakes to voice and eats few bites of magic cup for me, but otherwise lethargic and does not participate in conversation. Denies pain.   I introduced Palliative Medicine as specialized medical care for people living with serious illness. It focuses on providing relief from the symptoms and stress of a serious illness.  We discussed a brief life review of the patient. Patient known to me from previous admission. She has been at ALF since April 2018. Up until then, she lived independently and able to perform ADL's. Son, Sheri Webster speaks of the challenges with her losing her independence since transition to ALF. He is supportive and visits her often. Prior to fall, patient ambulating with walker and with fair appetite.  Discussed events leading up to hospitalization, diagnoses, and  interventions. Sheri Webster tells me she has continued to decline since admission. He understands CT performed yesterday was negative for acute findings. Discussed poor prognosis, likely less than two weeks with decrease in functional, cognitive, and nutritional status. Very poor PO intake. Also, audible secretions with risk for aspiration/aspiration pneumonia.   Advanced directives, concepts specific to code status, artifical feeding and hydration, and rehospitalization were considered and discussed. Sheri Webster is an only child. He confirms his mother's wishes against heroic interventions including resuscitation, life support, or feeding tube telling me this would not give her a "quality of life." I agreed with his mother's wishes explaining underlying age, co-morbidities, and poor outcomes of CPR.   The difference between aggressive medical intervention and comfort care was considered in light of the patient's goals of care. Sheri Webster is tearful at the thought of losing his mother but also accepting that "this was going to happen eventually" and "to all of [place]." He confirms wanting comfort for her.   Educated on comfort measures only with focus on comfort, quality, and dignity at EOL. Educated on EOL expectations and using medications as needed for symptom management to ensure relief from suffering. Educated on hospice philosophy and hospice options. I explained that she is appropriate for hospice facility with likely less than two week prognosis. Discussed comfort feeds.  Spiritual  family. Doristine Bosworth was at bedside when I arrived to room. Sheri Webster tells me he receives frequent support from their pastor.    Questions and concerns were addressed. Sheri Webster has PMT contact information.   SUMMARY OF RECOMMENDATIONS    DNR/DNI  Transition to comfort measures only. Discontinued interventions/medications not aimed at comfort.  Symptom management--see below.   Comfort feeds per patient/family request.  SW consult  for residential hospice.  Code Status/Advance Care Planning:  DNR  Symptom Management:   Morphine 64m IV q2h prn pain/dyspnea/air hunger   Roxanol 518mSL q2h prn pain/dyspnea/air hunger  Robinul 0.82m58mV q4h prn secretions  Ativan 0.5mg49m q4h prn anxiety  Palliative Prophylaxis:   Aspiration, Delirium Protocol, Frequent Pain Assessment, Oral Care and Turn Reposition  Additional Recommendations (Limitations, Scope, Preferences):  Full Comfort Care  Psycho-social/Spiritual:   Desire for further Chaplaincy support: yes  Additional Recommendations: Caregiving  Support/Resources and Education on Hospice  Prognosis:   < 2 weeks secondary to fall, hip fracture s/p nailing, severe functional/nutritonal/cognitive status decline  Discharge Planning: Hospice facility      Primary Diagnoses: Present on Admission: . Hip fracture (HCC)[place] reviewed the medical record, interviewed the patient and family, and examined the patient. The following aspects are pertinent.  Past Medical History:  Diagnosis Date  . Arthritis   . H/O prosthetic heart valve   . Hyperlipidemia   . Hypertension   . Hypothyroidism   . Mild dementia    Social History   Socioeconomic History  . Marital status: Widowed    Spouse name: None  . Number of children: None  . Years of education: None  . Highest education level: None  Social Needs  . Financial resource strain: None  . Food insecurity - worry: None  . Food insecurity - inability: None  . Transportation needs - medical: None  . Transportation needs - non-medical: None  Occupational History  . None  Tobacco Use  . Smoking status: Former Smoker    Last attempt to quit: 11/04/1995    Years since quitting: 22.0  . Smokeless tobacco: Never Used  Substance and Sexual Activity  . Alcohol use: No  . Drug use: No  . Sexual activity: None  Other Topics Concern  . None  Social History Narrative  . None   Family History  Problem  Relation Age of Onset  . Bladder Cancer Neg Hx   . Kidney cancer Neg Hx    Scheduled Meds:  Continuous Infusions: PRN Meds:.acetaminophen **OR** acetaminophen, albuterol, alum & mag hydroxide-simeth, bisacodyl, cyclobenzaprine, glycopyrrolate, hydrALAZINE, LORazepam, magnesium hydroxide, menthol-cetylpyridinium **OR** phenol, metoCLOPramide **OR** metoCLOPramide (REGLAN) injection, morphine injection, morphine CONCENTRATE, ondansetron **OR** ondansetron (ZOFRAN) IV, polyethylene glycol, senna, sodium phosphate, zolpidem Medications Prior to Admission:  Prior to Admission medications   Medication Sig Start Date End Date Taking? Authorizing Provider  acetaminophen (TYLENOL) 500 MG tablet Take 500 mg by mouth every 6 (six) hours as needed.   Yes [provider]  alendronate (FOSAMAX) 70 MG tablet Take 1 tablet (70 mg total) by mouth once a week. Take with a full glass of water on an empty stomach. 06/24/17  Yes Wieting, Richard, MD  aspirin EC 81 MG tablet Take 81 mg by mouth 2 (two) times daily.    Yes [provider]  bisacodyl (DULCOLAX) 10 MG suppository Place 10 mg rectally as needed for moderate constipation.   Yes [provider]  calcium-vitamin D (OSCAL WITH D) 500-200 MG-UNIT tablet Take 1  tablet by mouth 2 (two) times daily.    Yes [provider]  carvedilol (COREG) 6.25 MG tablet Take 6.25 mg by mouth 2 (two) times daily with a meal.   Yes [provider]  cyclobenzaprine (FLEXERIL) 5 MG tablet Take 5 mg by mouth 3 (three) times daily as needed for muscle spasms.   Yes [provider]  ergocalciferol (VITAMIN D2) 50000 units capsule Take 50,000 Units by mouth once a week.   Yes [provider]  furosemide (LASIX) 20 MG tablet Take 10 mg by mouth daily.   Yes [provider]  levothyroxine (SYNTHROID, LEVOTHROID) 88 MCG tablet Take 88 mcg by mouth daily before breakfast.   Yes [provider]  magnesium  hydroxide (MILK OF MAGNESIA) 400 MG/5ML suspension Take by mouth daily as needed for mild constipation.   Yes [provider]  Melatonin 3 MG TABS Take 6 mg by mouth at bedtime.    Yes [provider]  nystatin (NYSTATIN) powder Apply topically daily.    Yes [provider]  pravastatin (PRAVACHOL) 40 MG tablet Take 40 mg by mouth daily.   Yes [provider]  senna (SENOKOT) 8.6 MG tablet Take 2 tablets by mouth 2 (two) times daily.    Yes [provider]  traZODone (DESYREL) 50 MG tablet Take 25 mg by mouth at bedtime.    Yes [provider]  amLODipine (NORVASC) 5 MG tablet Take 1 tablet (5 mg total) by mouth daily. Patient not taking: Reported on 11/20/2017 06/24/17   Epifanio Lesches, MD  amoxicillin-clavulanate (AUGMENTIN) 875-125 MG tablet Take 1 tablet every 12 (twelve) hours by mouth. Patient not taking: Reported on 11/20/2017 09/09/17   Hollice Espy, MD  fentaNYL (DURAGESIC - DOSED MCG/HR) 25 MCG/HR patch Place 1 patch (25 mcg total) onto the skin every 3 (three) days. Patient not taking: Reported on 08/15/2017 06/25/17   Epifanio Lesches, MD  metaxalone (SKELAXIN) 800 MG tablet Take 1 tablet (800 mg total) by mouth 3 (three) times daily. Patient not taking: Reported on 08/15/2017 06/23/17   Epifanio Lesches, MD  sulfamethoxazole-trimethoprim (BACTRIM DS,SEPTRA DS) 800-160 MG tablet Take 1 tablet by mouth 2 (two) times daily. Patient not taking: Reported on 11/20/2017 09/04/17   Hollice Espy, MD  traMADol (ULTRAM) 50 MG tablet Take 1 tablet (50 mg total) by mouth every 6 (six) hours as needed for moderate pain. Patient not taking: Reported on 08/15/2017 06/23/17   Epifanio Lesches, MD   Allergies  Allergen Reactions  . Keflex [Cephalexin]    Review of Systems  Unable to perform ROS: Acuity of condition   Physical Exam  Constitutional: She appears lethargic. She appears ill.  HENT:  Head: Normocephalic and  atraumatic.  Cardiovascular: An irregularly irregular rhythm present.  Pulmonary/Chest: No accessory muscle usage. No tachypnea. No respiratory distress. She has decreased breath sounds. She has rhonchi.  Abdominal: Normal appearance.  Neurological: She appears lethargic.  Opens eyes to voice, answers few questions. Not following commands  Skin: Skin is warm and dry. There is pallor.  Psychiatric: Her speech is delayed. Cognition and memory are impaired. She is inattentive.  Nursing note and vitals reviewed.  Vital Signs: BP 116/61 (BP Location: Left Arm)   Pulse 83   Temp 98.5 F (36.9 C) (Axillary)   Resp 16   Ht 5' 2"  (1.575 m)   Wt 56.2 kg (124 lb)   SpO2 96%   BMI 22.68 kg/m  Pain Assessment: PAINAD POSS *See Group Information*:  S-Acceptable,Sleep, easy to arouse Pain Score: Asleep  SpO2: SpO2: 96 % O2 Device:SpO2: 96 % O2 Flow Rate: .O2 Flow Rate (L/min): 2 L/min  IO: Intake/output summary:   Intake/Output Summary (Last 24 hours) at 11/24/2017 1337 Last data filed at 11/24/2017 0121 Gross per 24 hour  Intake -  Output 100 ml  Net -100 ml    LBM: Last BM Date: 11/23/17 Baseline Weight: Weight: 59.9 kg (132 lb) Most recent weight: Weight: 56.2 kg (124 lb)     Palliative Assessment/Data: PPS 20%   Flowsheet Rows     Most Recent Value  Intake Tab  Referral Department  Hospitalist  Unit at Time of Referral  Med/Surg Unit  Palliative Care Primary Diagnosis  Other (Comment) [Fall/hip fracture]  Palliative Care Type  Return patient Palliative Care  Reason for referral  Clarify Goals of Care  Date first seen by Palliative Care  11/24/17  Clinical Assessment  Palliative Performance Scale Score  20%  Psychosocial & Spiritual Assessment  Palliative Care Outcomes  Patient/Family meeting held?  Yes  Who was at the meeting?  patient and son Sheri Webster)  Palliative Care Outcomes  Clarified goals of care, Provided end of life care assistance, Provided psychosocial or  spiritual support, Counseled regarding hospice, Improved pain interventions, Improved non-pain symptom therapy, Transitioned to hospice      Time In: 0950 Time Out: 1100 Time Total: 66mn Greater than 50%  of this time was spent counseling and coordinating care related to the above assessment and plan.  Signed by:  MIhor Dow FNP-C Palliative Medicine Team  Phone: [phone]: [phone]  Please contact Palliative Medicine Team phone at [phone] questions and concerns.  For individual provider: See AShea Evans

## 2017-11-24 NOTE — Progress Notes (Signed)
Clinical Social Worker (CSW) received a residential hospice referral. CSW contacted patient's son Loistine Chancehilip and presented hospice choice. [place] [facility] [place]. [facility] is aware of above.   [employer] Hughes IncorporatedBailey Zamoria Boss, LCSW [phone]

## 2017-11-24 NOTE — Progress Notes (Signed)
Subjective: Patient is listless and somnolent.  Hospice services met with the family and they wish to have her go to the hospice house if of bed is available.  3 Days Post-Op Procedure(s) (LRB): INTRAMEDULLARY (IM) NAIL INTERTROCHANTRIC (Right)    Patient reports pain as mild.  Objective:   VITALS:   Vitals:   11/24/17 0745 11/24/17 1140  BP: 116/61   Pulse: 83   Resp: 16   Temp: 98.5 F (36.9 C)   SpO2: 100% 96%    Neurologically intact ABD soft Neurovascular intact Sensation intact distally Intact pulses distally Dorsiflexion/Plantar flexion intact Incision: scant drainage  LABS Recent Labs    11/22/17 0402 11/23/17 0359 11/24/17 0347  HGB 10.4* 8.4* 7.3*  HCT 31.0* 24.5* 21.3*  WBC 13.1* 14.0* 12.0*  PLT 180 160 171    Recent Labs    11/22/17 0402 11/23/17 0359 11/24/17 0347  NA 137 135 135  K 3.7 3.8 3.4*  BUN 30* 44* 38*  CREATININE 1.16* 1.54* 1.09*  GLUCOSE 184* 136* 134*    No results for input(s): LABPT, INR in the last 72 hours.   Assessment/Plan: 3 Days Post-Op Procedure(s) (LRB): INTRAMEDULLARY (IM) NAIL INTERTROCHANTRIC (Right)   Up with therapy Discharge to SNF or hospice  Switch to ASA 3108m  BID  RTC 2 weeks

## 2017-11-24 NOTE — Progress Notes (Signed)
Family Meeting Note  Advance Directive:yes  Today a meeting took place with the Patient and son at bedside.  Patient is unable to participate due ZO:XWRUEAto:Lacked capacity Comatose   The following clinical team members were present during this meeting:MD  The following were discussed:Patient's diagnosis:   84 year old woman with a past medical history of mild dementia, hypothyroidism, hypertension, hyperlipidemia, prosthetic heart valve, and arthritis, [facility] mechanical fall, found to have a proximal right hip fracture. Repair done on 1/19. Patient has remained lethargic, not eating or drinking. Son [name]  has chosen to focus on his mother's comfort and wishes for her to discharge to the hospice home.   Patient's progosis: < 2 weeks and Goals for treatment: DNR  Additional follow-up to be provided: Hospice home tomorrow  Time spent during discussion:20 minutes  Delfino LovettVipul Anahita Cua, MD

## 2017-11-24 NOTE — Progress Notes (Signed)
[facility] - [place] at [facility]   PATIENT NAME: Sheri Webster    MR#:  [id number]  DATE OF BIRTH:  [birth year]  SUBJECTIVE:  CHIEF COMPLAINT:   Chief Complaint  Patient presents with  . Hip Pain    Hx Fx Right lower Leg, C/O right hip pain  remains lethargic. REVIEW OF SYSTEMS:    Review of Systems  Unable to perform ROS: Mental acuity   DRUG ALLERGIES:   Allergies  Allergen Reactions  . Keflex [Cephalexin]     VITALS:  Blood pressure 116/61, pulse 83, temperature 98.5 F (36.9 C), temperature source Axillary, resp. rate 16, height 5\' 2"  (1.575 m), weight 56.2 kg (124 lb), SpO2 96 %.  PHYSICAL EXAMINATION:   Physical Exam  GENERAL:  84 y.o.-year-old patient lying in the bed with no acute distress.  EYES: Pupils equal, round, reactive to light and accommodation. No scleral icterus. Extraocular muscles intact.  HEENT: Head atraumatic, normocephalic. Oropharynx and nasopharynx clear.  NECK:  Supple, no jugular venous distention. No thyroid enlargement, no tenderness.  LUNGS: Normal breath sounds bilaterally, no wheezing, rales, rhonchi. No use of accessory muscles of respiration.  CARDIOVASCULAR: S1, S2 normal. No murmurs, rubs, or gallops.  ABDOMEN: Soft, nontender, nondistended. Bowel sounds present. No organomegaly or mass.  EXTREMITIES: No cyanosis, clubbing. Has edema b/l.    Right lower extremity shortened and externally rotated NEUROLOGIC: Moving all 4 extremities PSYCHIATRIC: The patient is comatose SKIN: No obvious rash, lesion, or ulcer.   LABORATORY PANEL:   CBC Recent Labs  Lab 11/24/17 0347  WBC 12.0*  HGB 7.3*  HCT 21.3*  PLT 171   ------------------------------------------------------------------------------------------------------------------ Chemistries  Recent Labs  Lab 11/24/17 0347  NA 135  K 3.4*  CL 104  CO2 21*  GLUCOSE 134*  BUN 38*  CREATININE 1.09*  CALCIUM 7.7*    ------------------------------------------------------------------------------------------------------------------  Cardiac Enzymes No results for input(s): TROPONINI in the last 168 hours. ------------------------------------------------------------------------------------------------------------------  RADIOLOGY:  Ct Head Wo Contrast  Result Date: 11/23/2017 CLINICAL DATA:  Altered mental status and flaccid right arm EXAM: CT HEAD WITHOUT CONTRAST TECHNIQUE: Contiguous axial images were obtained from the base of the skull through the vertex without intravenous contrast. COMPARISON:  08/15/2017 FINDINGS: Brain: Diffuse atrophic changes are identified. Chronic white matter ischemic change is noted similar to that seen on prior exam. No findings to suggest acute hemorrhage, acute infarction or space-occupying mass lesion are noted. Vascular: No hyperdense vessel or unexpected calcification. Skull: Normal. Negative for fracture or focal lesion. Sinuses/Orbits: No acute finding. Postsurgical changes are noted in the globes bilaterally. Other: None. IMPRESSION: Chronic atrophic changes and white matter ischemic change. No acute abnormality noted. Electronically Signed   By: Alcide CleverMark  Lukens M.D.   On: 11/23/2017 15:51     ASSESSMENT AND PLAN:    * Right proximal femur fracture Status post surgery on 11/21/2017.  * Rt UE weakness/flaccid state:   *Acute blood loss anemia s/p surgery Received 1 unit packed RBC transfusion. Hemoglobin 8.4->7.3. Ortho has ordered 1 more unit of packed red blood cell  *Accelerated hypertension.  Likely due to severe hip pain.   *Fall.  History of recurrent falls.  Has mild dementia  *DVT prophylaxis.  SCDs.  Post surgery DVT prophylaxis per orthopedics.   Hospice home tomorrow   All the records are reviewed and case discussed with Care Management/Social Worker Management plans discussed with the patient, family (left message to son) and they are in  agreement.  CODE STATUS: DNR  DVT Prophylaxis: SCDs  TOTAL TIME TAKING CARE OF THIS PATIENT: 35 minutes.   POSSIBLE D/C IN 1 DAYS, DEPENDING ON CLINICAL CONDITION.  Delfino Lovett M.D on 11/24/2017 at 3:44 PM  Between 7am to 6pm - Pager - [phone]  After 6pm go to [email or web address] - password [email or web address]  [facility] [place] [facility]  Office  [phone]  CC: Primary care physician; Patient, No Pcp Per  Note: This dictation was prepared with Dragon dictation along with smaller phrase technology. Any transcriptional errors that result from this process are unintentional.

## 2017-11-25 DIAGNOSIS — S72001S Fracture of unspecified part of neck of right femur, sequela: Secondary | ICD-10-CM

## 2017-11-25 DIAGNOSIS — M25551 Pain in right hip: Secondary | ICD-10-CM

## 2017-11-25 LAB — TYPE AND SCREEN
ABO/RH(D): O NEG
Antibody Screen: NEGATIVE
UNIT DIVISION: 0

## 2017-11-25 LAB — HEMOGLOBIN AND HEMATOCRIT, BLOOD
HCT: 24.6 % — ABNORMAL LOW (ref 35.0–47.0)
HEMOGLOBIN: 8.3 g/dL — AB (ref 12.0–16.0)

## 2017-11-25 LAB — BPAM RBC
BLOOD PRODUCT EXPIRATION DATE: 201902042359
ISSUE DATE / TIME: 201901221724
UNIT TYPE AND RH: 9500

## 2017-11-25 NOTE — Progress Notes (Signed)
Daily Progress Note   Patient Name: Sheri Webster       Date: 11/25/2017 DOB: 09-23-1921  Age: 84 y.o. MRN#: [id number] Attending Physician: Delfino LovettShah, Vipul, MD Primary Care Physician: Patient, No Pcp Per Admit Date: 11/20/2017  Reason for Consultation/Follow-up: Establishing goals of care and Hospice Evaluation  Subjective: Patient wakes to voice. Pleasantly confused. Denies pain. She does eat half a cup of applesauce with my full assist. Becomes slightly short of breath and requires multiple swallows to clear.   GOC:  Son at bedside who tells me she is a little more awake today but remains with very poor appetite. We discussed her being stable for transfer to hospice facility today if attending agrees. Emotional/spiritual support provided. Therapeutic listening to family members at bedside.   Length of Stay: 5  Current Medications: Scheduled Meds:    Continuous Infusions:   PRN Meds: acetaminophen **OR** acetaminophen, albuterol, alum & mag hydroxide-simeth, bisacodyl, cyclobenzaprine, glycopyrrolate, hydrALAZINE, LORazepam, magnesium hydroxide, menthol-cetylpyridinium **OR** phenol, metoCLOPramide **OR** metoCLOPramide (REGLAN) injection, morphine injection, morphine CONCENTRATE, ondansetron **OR** ondansetron (ZOFRAN) IV, polyethylene glycol, senna, sodium phosphate, zolpidem  Physical Exam  Constitutional: She is easily aroused. She appears ill.  HENT:  Head: Normocephalic and atraumatic.  Cardiovascular: Regular rhythm.  Pulmonary/Chest: No accessory muscle usage. No tachypnea. No respiratory distress. She has decreased breath sounds.  Neurological: She is easily aroused.  Wakes to voice  Skin: Skin is warm and dry. There is pallor.  Psychiatric: Her speech is delayed. She is  inattentive.  Nursing note and vitals reviewed.          Vital Signs: BP (!) 125/58 (BP Location: Left Arm)   Pulse 66   Temp 98.3 F (36.8 C) (Oral)   Resp 14   Ht 5\' 2"  (1.575 m)   Wt 53.5 kg (118 lb)   SpO2 97%   BMI 21.58 kg/m  SpO2: SpO2: 97 % O2 Device: O2 Device: Not Delivered O2 Flow Rate: O2 Flow Rate (L/min): 2 L/min  Intake/output summary:   Intake/Output Summary (Last 24 hours) at 11/25/2017 1112 Last data filed at 11/25/2017 0945 Gross per 24 hour  Intake 431.07 ml  Output -  Net 431.07 ml   LBM: Last BM Date: 11/25/17 Baseline Weight: Weight: 59.9 kg (132 lb) Most recent weight: Weight: 53.5 kg (118 lb)  Palliative Assessment/Data: PPS 20%   Flowsheet Rows     Most Recent Value  Intake Tab  Referral Department  Hospitalist  Unit at Time of Referral  Med/Surg Unit  Palliative Care Primary Diagnosis  Other (Comment) [Fall/hip fracture]  Date Notified  11/23/17  Palliative Care Type  Return patient Palliative Care  Reason for referral  Clarify Goals of Care  Date of Admission  11/20/17  Date first seen by Palliative Care  11/24/17  # of days Palliative referral response time  1 Day(s)  # of days IP prior to Palliative referral  3  Clinical Assessment  Palliative Performance Scale Score  20%  Psychosocial & Spiritual Assessment  Palliative Care Outcomes  Patient/Family meeting held?  Yes  Who was at the meeting?  patient and son [name])  Palliative Care Outcomes  Clarified goals of care, Provided end of life care assistance, Provided psychosocial or spiritual support, Counseled regarding hospice, Improved pain interventions, Improved non-pain symptom therapy, Transitioned to hospice      Patient Active Problem List   Diagnosis Date Noted  . Adult failure to thrive   . At high risk for aspiration   . Terminal care   . Altered mental status   . Closed right hip fracture, initial encounter (HCC) 11/20/2017  . Fall   . Dehydration   .  Acute midline thoracic back pain   . Palliative care by specialist   . Goals of care, counseling/discussion   . Syncope 06/18/2017    Palliative Care Assessment & Plan   Patient Profile: 84 y.o. female  with past medical history of mild dementia, hypothyroidism, hypertension, hyperlipidemia, prosthetic heart valve, and arthritis admitted on 11/20/2017 with right hip pain after fall. Found to have proximal hip fracture s/p right intramedullary nailing on 1/19. Patient has continued to be minimally responsive with poor functional, nutritional, and cognitive status. Stat CT head performed 1/21 due to RUE being flaccid. Negative for acute findings. Guarded prognosis. Palliative medicine consultation for goals of care/terminal care.   Assessment: Fall Right proximal femur fracture s/p nailing Acute blood loss Mild dementia Dysphagia High risk for aspiration  Recommendations/Plan:  DNR/DNI  Continue comfort measures and medications prn for comfort.   Comfort feeds per patient/family request.   Stable for transfer to hospice facility if bed available.   Goals of Care and Additional Recommendations:  Limitations on Scope of Treatment: Full Comfort Care  Code Status: DNR/DNI   Code Status Orders  (From admission, onward)        Start     Ordered   11/20/17 1720  Do not attempt resuscitation (DNR)  Continuous    Question Answer Comment  In the event of cardiac or respiratory ARREST Do not call a "code blue"   In the event of cardiac or respiratory ARREST Do not perform Intubation, CPR, defibrillation or ACLS   In the event of cardiac or respiratory ARREST Use medication by any route, position, wound care, and other measures to relive pain and suffering. May use oxygen, suction and manual treatment of airway obstruction as needed for comfort.      11/20/17 1719    Code Status History    Date Active Date Inactive Code Status Order ID Comments User Context   11/20/2017 17:18  11/20/2017 17:19 Full Code [id number]  Milagros Loll, MD ED   06/19/2017 10:43 06/24/2017 17:19 DNR [id number]  Delfino Lovett, MD Inpatient   06/18/2017 15:47 06/19/2017 10:43 Full Code [id number]  Katha Hamming, MD ED  Prognosis:   < 2 weeks  Discharge Planning:  Hospice facility  Care plan was discussed with patient, son, hospice liaison, SW, Dr Sherryll Burger  Thank you for allowing the Palliative Medicine Team to assist in the care of this patient.   Time In: 1000 Time Out: 1030 Total Time 30 Prolonged Time Billed  no      Greater than 50%  of this time was spent counseling and coordinating care related to the above assessment and plan.  Vennie Homans, FNP-C Palliative Medicine Team  Phone: [phone] Fax: [phone]  Please contact Palliative Medicine Team phone at [phone] for questions and concerns.

## 2017-11-25 NOTE — Progress Notes (Signed)
Follow up visit made to new hospice home referral. Patient seen with NP Vennie HomansMegan Mason and earlier with attending physician Dr. Sherryll BurgerShah. At this time patient does appear to be stable for transport. She is responsive to mouth care and intermittently responsive to voice. Son, brother and nephew present during visit and in agreement with transfer. Patient information faxed to referral. Report called to the Hospice home EMS notified with a  Request for a 3 pm transport. Family and hospital care team aware.  Dayna BarkerKaren Robertson RN, BSN, [facility] [place], hospital Liaison [phone] c

## 2017-11-25 NOTE — Discharge Summary (Addendum)
[facility] - [place] [facility]   PATIENT NAME: Sheri Webster    MR#:  [id number]  DATE OF BIRTH:  1921-03-01  DATE OF ADMISSION:  11/20/2017   ADMITTING PHYSICIAN: Milagros LollSrikar Sudini, MD  DATE OF DISCHARGE: 11/25/2017  PRIMARY CARE PHYSICIAN: Patient, No Pcp Per   ADMISSION DIAGNOSIS:  Acute pain of right hip [M25.551] Closed right hip fracture, initial encounter (HCC) [S72.001A] Closed fracture of one rib of right side, initial encounter [S22.31XA] DISCHARGE DIAGNOSIS:  Active Problems:   Closed fracture of right hip (HCC)   Adult failure to thrive   At high risk for aspiration   Terminal care   Altered mental status   Acute pain of right hip  SECONDARY DIAGNOSIS:   Past Medical History:  Diagnosis Date  . Arthritis   . H/O prosthetic heart valve   . Hyperlipidemia   . Hypertension   . Hypothyroidism   . Mild dementia    HOSPITAL COURSE:  *Right proximal femur fracture Status post surgery on 11/21/2017.  * Rt UE weakness/flaccid state:   *Acute blood loss anemia s/p surgery Received 2 unit packed RBC transfusion.  *Accelerated hypertension. Likely due to severe hip pain.   *Fall. History of recurrent falls. Has mild dementia  DISCHARGE CONDITIONS:  fair CONSULTS OBTAINED:  Treatment Team:  Deeann SaintMiller, Howard, MD DRUG ALLERGIES:   Allergies  Allergen Reactions  . Keflex [Cephalexin]    DISCHARGE MEDICATIONS:   Allergies as of 11/25/2017      Reactions   Keflex [cephalexin]       Medication List    STOP taking these medications   acetaminophen 500 MG tablet Commonly known as:  TYLENOL   alendronate 70 MG tablet Commonly known as:  FOSAMAX   amLODipine 5 MG tablet Commonly known as:  NORVASC   amoxicillin-clavulanate 875-125 MG tablet Commonly known as:  AUGMENTIN   aspirin EC 81 MG tablet   bisacodyl 10 MG suppository Commonly known as:  DULCOLAX   calcium-vitamin D 500-200 MG-UNIT tablet Commonly known as:   OSCAL WITH D   carvedilol 6.25 MG tablet Commonly known as:  COREG   cyclobenzaprine 5 MG tablet Commonly known as:  FLEXERIL   ergocalciferol 50000 units capsule Commonly known as:  VITAMIN D2   fentaNYL 25 MCG/HR patch Commonly known as:  DURAGESIC - dosed mcg/hr   furosemide 20 MG tablet Commonly known as:  LASIX   levothyroxine 88 MCG tablet Commonly known as:  SYNTHROID, LEVOTHROID   magnesium hydroxide 400 MG/5ML suspension Commonly known as:  MILK OF MAGNESIA   Melatonin 3 MG Tabs   metaxalone 800 MG tablet Commonly known as:  SKELAXIN   nystatin powder Generic drug:  nystatin   pravastatin 40 MG tablet Commonly known as:  PRAVACHOL   senna 8.6 MG tablet Commonly known as:  SENOKOT   sulfamethoxazole-trimethoprim 800-160 MG tablet Commonly known as:  BACTRIM DS,SEPTRA DS   traMADol 50 MG tablet Commonly known as:  ULTRAM   traZODone 50 MG tablet Commonly known as:  DESYREL        DISCHARGE INSTRUCTIONS:   DIET:  Regular diet DISCHARGE CONDITION:  Fair ACTIVITY:  Activity as tolerated OXYGEN:  Home Oxygen: No.  Oxygen Delivery: room air DISCHARGE LOCATION:  Hospice Home   If you experience worsening of your admission symptoms, develop shortness of breath, life threatening emergency, suicidal or homicidal thoughts you must seek medical attention immediately by calling 911 or calling your MD immediately  if symptoms  less severe.  You Must read complete instructions/literature along with all the possible adverse reactions/side effects for all the Medicines you take and that have been prescribed to you. Take any new Medicines after you have completely understood and accpet all the possible adverse reactions/side effects.   Please note  You were cared for by a hospitalist during your hospital stay. If you have any questions about your discharge medications or the care you received while you were in the hospital after you are discharged, you can  call the unit and asked to speak with the hospitalist on call if the hospitalist that took care of you is not available. Once you are discharged, your primary care physician will handle any further medical issues. Please note that NO REFILLS for any discharge medications will be authorized once you are discharged, as it is imperative that you return to your primary care physician (or establish a relationship with a primary care physician if you do not have one) for your aftercare needs so that they can reassess your need for medications and monitor your lab values.    On the day of Discharge:  VITAL SIGNS:  Blood pressure (!) 125/58, pulse 66, temperature 98.3 F (36.8 C), temperature source Oral, resp. rate 14, height 5\' 2"  (1.575 m), weight 53.5 kg (118 lb), SpO2 97 %. PHYSICAL EXAMINATION:  GENERAL:  84 y.o.-year-old patient lying in the bed with no acute distress.  EYES: Pupils equal, round, reactive to light and accommodation. No scleral icterus. Extraocular muscles intact.  HEENT: Head atraumatic, normocephalic. Oropharynx and nasopharynx clear.  NECK:  Supple, no jugular venous distention. No thyroid enlargement, no tenderness.  LUNGS: Normal breath sounds bilaterally, no wheezing, rales,rhonchi or crepitation. No use of accessory muscles of respiration.  CARDIOVASCULAR: S1, S2 normal. No murmurs, rubs, or gallops.  ABDOMEN: Soft, non-tender, non-distended. Bowel sounds present. No organomegaly or mass.  EXTREMITIES: No pedal edema, cyanosis, or clubbing.  NEUROLOGIC: Cranial nerves II through XII are intact. Muscle strength 5/5 in all extremities. Sensation intact. Gait not checked.  PSYCHIATRIC: The patient is lethargic SKIN: No obvious rash, lesion, or ulcer.  DATA REVIEW:   CBC Recent Labs  Lab 11/24/17 0347 11/25/17 1421  WBC 12.0*  --   HGB 7.3* 8.3*  HCT 21.3* 24.6*  PLT 171  --     Chemistries  Recent Labs  Lab 11/24/17 0347  NA 135  K 3.4*  CL 104  CO2 21*    GLUCOSE 134*  BUN 38*  CREATININE 1.09*  CALCIUM 7.7*     Management plans discussed with the patient, family and they are in agreement.  CODE STATUS: DNR   TOTAL TIME TAKING CARE OF THIS PATIENT: 45 minutes.    Delfino Lovett M.D on 11/25/2017 at 3:45 PM  Between 7am to 6pm - Pager - [phone]  After 6pm go to [email or web address] - Social research officer, government  [facility] [place] [facility]  Office  [phone]  CC: Primary care physician; Patient, No Pcp Per   Note: This dictation was prepared with Dragon dictation along with smaller phrase technology. Any transcriptional errors that result from this process are unintentional.

## 2017-11-25 NOTE — Discharge Instructions (Signed)
Hospice Introduction Hospice is a service that is designed to provide people who are terminally ill and their families with medical, spiritual, and psychological support. Its aim is to improve your quality of life by keeping you as alert and comfortable as possible. Who will be my providers when I begin hospice care? Hospice teams often include:  A nurse.  A doctor. The hospice doctor will be available for your care, but you can bring your regular doctor or nurse practitioner.  Social workers.  Religious leaders (such as a Clinical biochemist).  Trained volunteers.  What roles will providers play in my care? Hospice is performed by a team of health care professionals and volunteers who:  Help keep you comfortable: ? Hospice can be provided in your home or in a homelike setting. ? The hospice staff works with your family and friends to help meet your needs. ? You will enjoy the support of loved ones by receiving much of your basic care from family and friends.  Provide pain relief and manage your symptoms. The staff supply all necessary medicines and equipment.  Provide companionship when you are alone.  Allow you and your family to rest. They may do light housekeeping, prepare meals, and run errands.  Provide counseling. They will make sure your emotional, spiritual, and social needs and those of your family are being met.  Provide spiritual care: ? Spiritual care will be individualized to meet your needs and your family's needs. ? Spiritual care may involve:  Helping you look at what death means to you.  Helping you say goodbye to your family and friends.  Performing a specific religious ceremony or ritual.  When should hospice care begin? Most people who use hospice are believed to have fewer than 6 months to live.  Your family and health care providers can help you decide when hospice services should begin.  If your condition improves, you may discontinue the program.  What  should I consider before selecting a program? Most hospice programs are run by nonprofit, independent organizations. Some are affiliated with hospitals, nursing homes, or home health care agencies. Hospice programs can take place in the home or at a hospice center, hospital, or skilled nursing facility. When choosing a hospice program, ask the following questions:  What services are available to me?  What services will be offered to my loved ones?  How involved will my loved ones be?  How involved will my health care provider be?  Who makes up the hospice care team? How are they trained or screened?  How will my pain and symptoms be managed?  If my circumstances change, can the services be provided in a different setting, such as my home or in the hospital?  Is the program reviewed and licensed by the state or certified in some other way?  Where can I learn more about hospice? You can learn about existing hospice programs in your area from your health care providers. You can also read more about hospice online. The websites of the following organizations contain helpful information:  The [facility] and Palliative Care Organization [facility]).  The Hospice Association of America ([place]).  The [place].  The American Cancer Society (ACS).  Hospice Net.  This information is not intended to replace advice given to you by your health care provider. Make sure you discuss any questions you have with your health care provider. Document Released: 02/06/2004 Document Revised: 06/05/2016 Document Reviewed: 08/30/2013 Elsevier Interactive Patient Education  2017 [employer].

## 2017-11-25 NOTE — Progress Notes (Signed)
Patient will D/C to [facility]. [facility]. Clinical Child psychotherapistocial Worker (CSW) completed D/C packet including DNR. Please reconsult if future social work needs arise. CSW signing off.   Baker Hughes IncorporatedBailey Terrance Usery, LCSW [phone]

## 2017-11-25 NOTE — Progress Notes (Signed)
Patient was picked up by EMS to Hospice.

## 2017-11-25 NOTE — Progress Notes (Signed)
Notified Dr Caryn BeeMaier for comfort measures order. Order received and placed.

## 2017-12-04 DEATH — deceased

## 2017-12-25 IMAGING — US US CAROTID DUPLEX BILAT
1 series · 13 of 24 positions shown · non-contrast
Comparison: Chest radiograph - 06/18/2017

CLINICAL DATA: Syncopal episode. History of hypertension and
hyperlipidemia. History of heart valve replacement. Former smoker.

EXAM:
BILATERAL CAROTID DUPLEX ULTRASOUND
TECHNIQUE: Gray scale imaging, color Doppler and duplex ultrasound were
performed of bilateral carotid and vertebral arteries in the neck.

[Series 1: us carotid duplex bilat · 0.06mm/px · 13 of 64 slices shown]
[im 1/64]
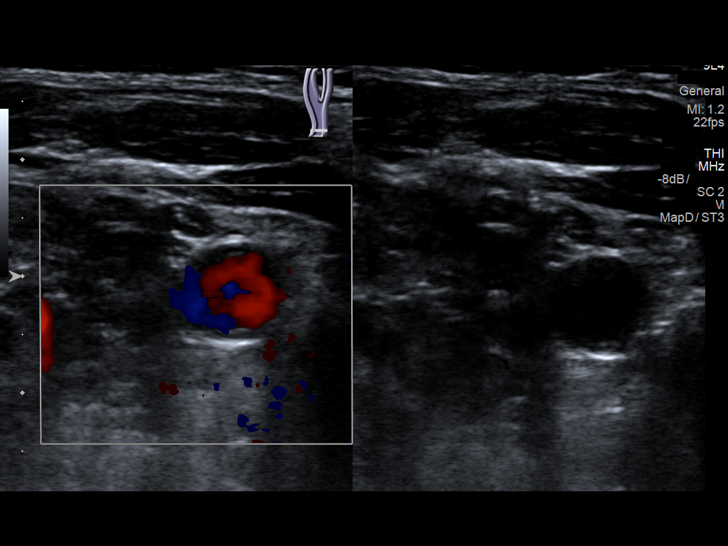
[im 6/64]
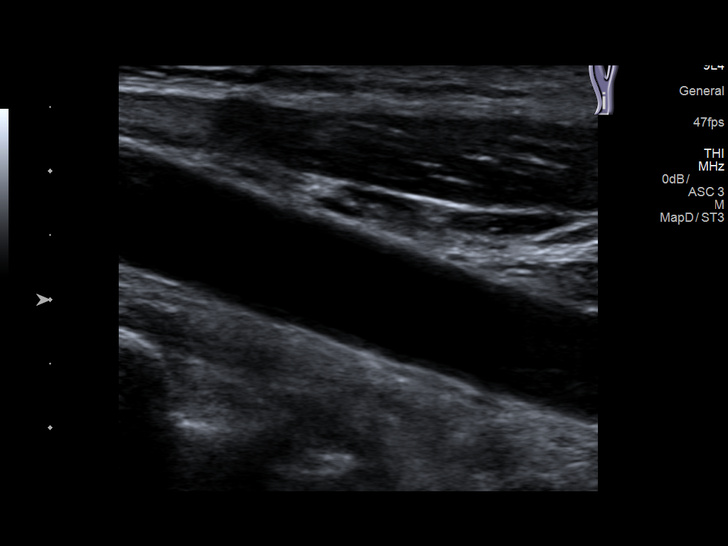
[im 11/64]
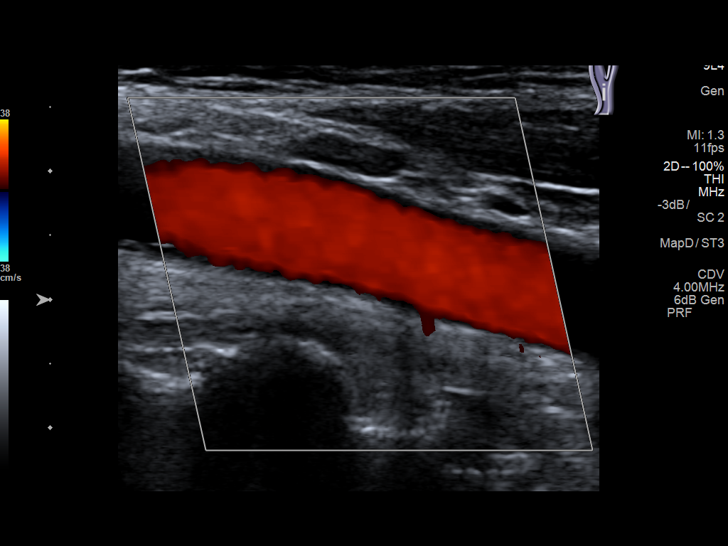
[im 17/64]
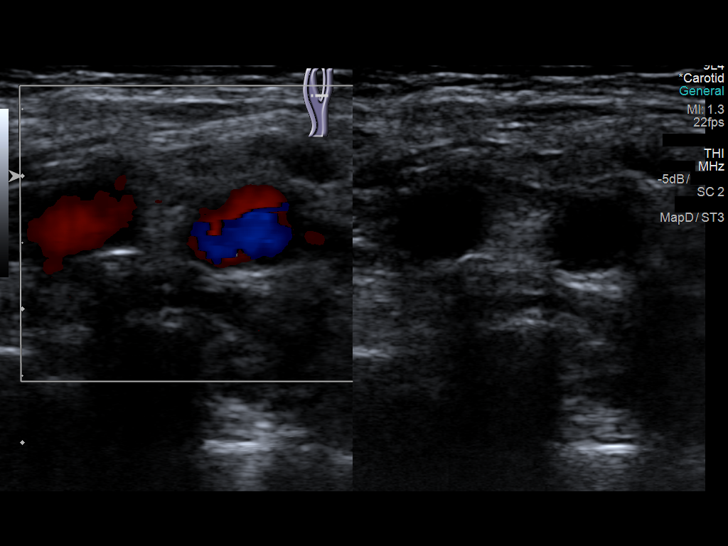
[im 22/64]
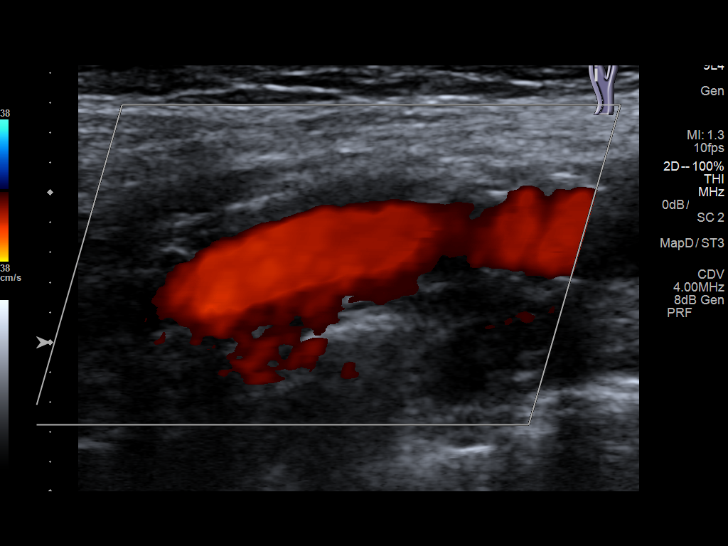
[im 28/64]
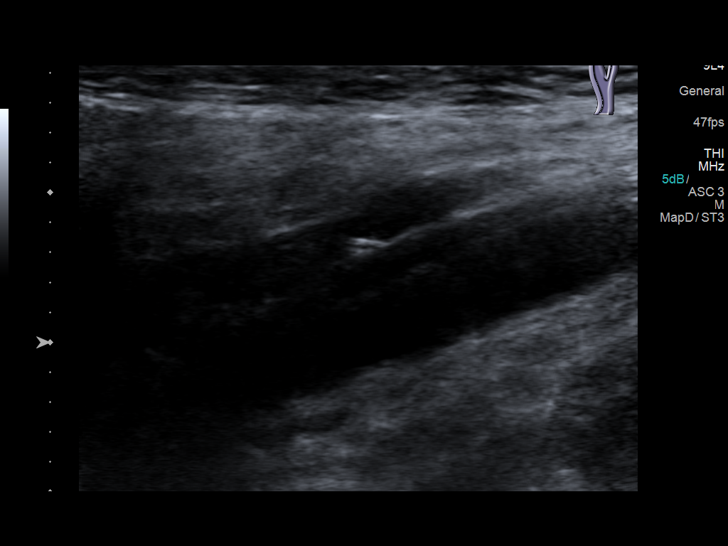
[im 33/64]
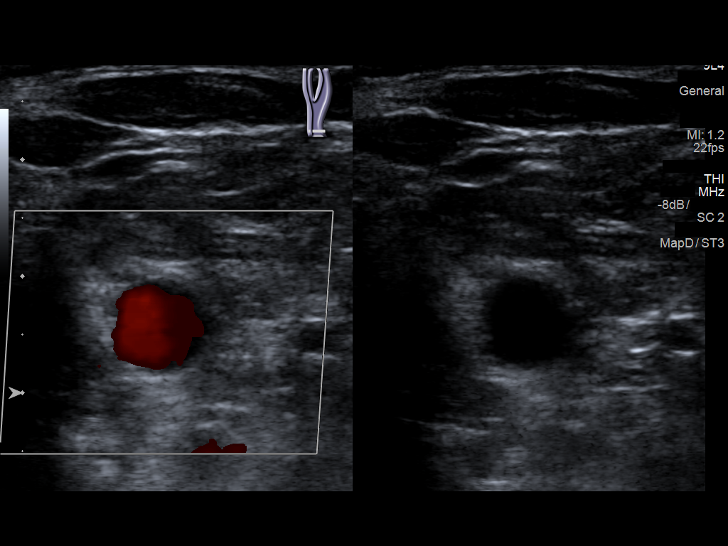
[im 36/64]
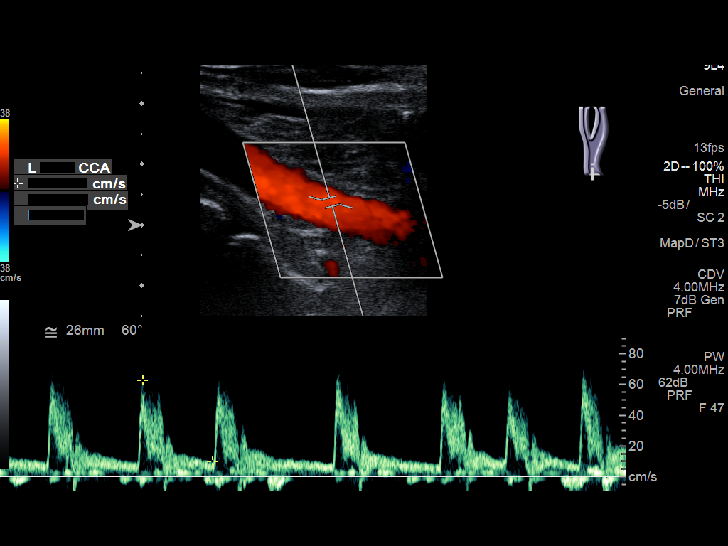
[im 42/64]
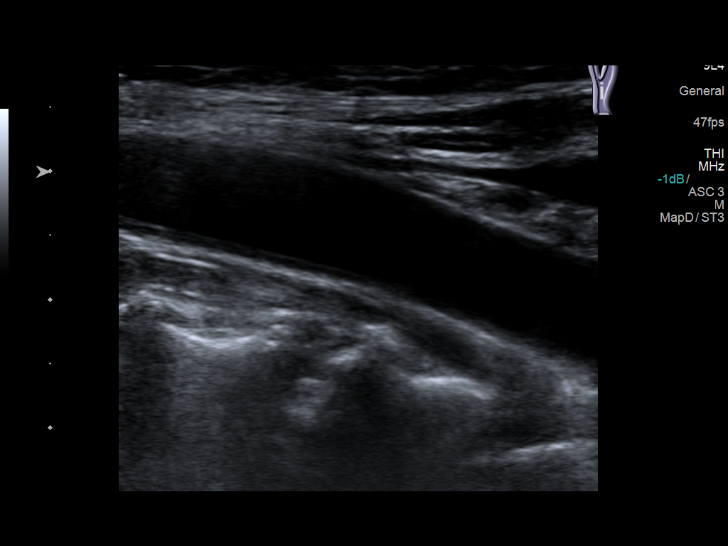
[im 47/64]
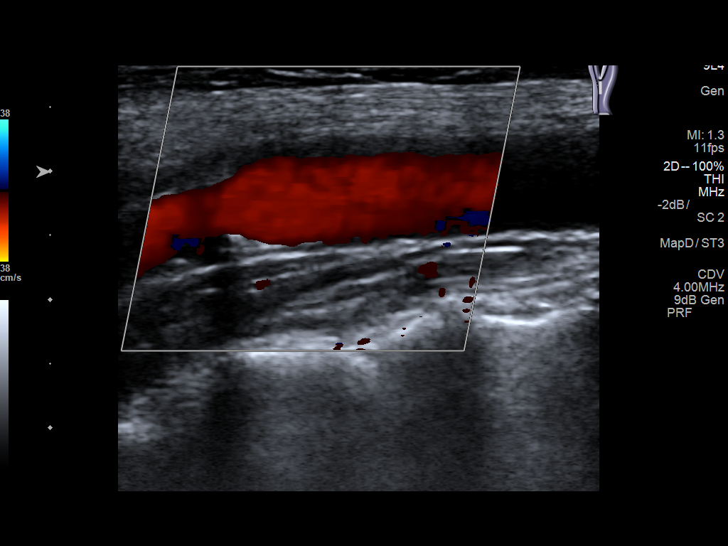
[im 53/64]
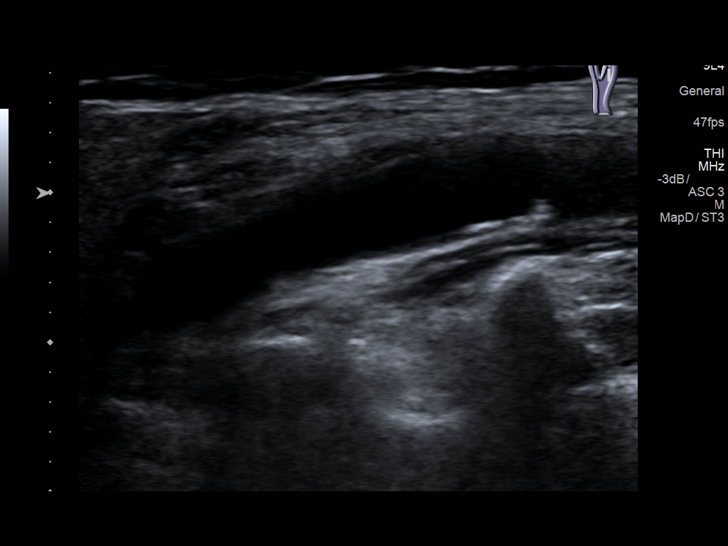
[im 58/64]
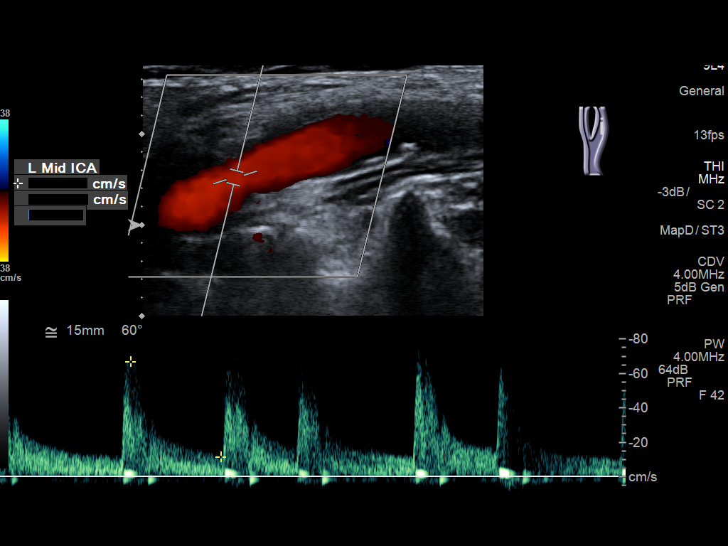
[im 64/64]
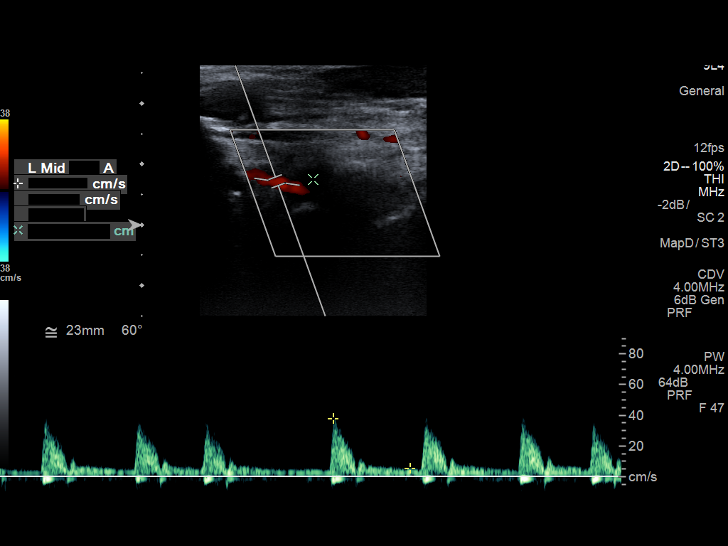

[13 of 24 positions shown; findings below may reference images not displayed]

FINDINGS: Criteria: Quantification of carotid stenosis is based on velocity
parameters that correlate the residual internal carotid diameter
with NASCET-based stenosis levels, using the diameter of the distal
internal carotid lumen as the denominator for stenosis measurement.

The following velocity measurements were obtained:

RIGHT

ICA:  62/16 cm/sec

CCA:  51/5 cm/sec

SYSTOLIC ICA/CCA RATIO:

DIASTOLIC ICA/CCA RATIO:

ECA:  60 cm/sec

LEFT

ICA:  77/20 cm/sec

CCA:  52/9 cm/sec

SYSTOLIC ICA/CCA RATIO:

DIASTOLIC ICA/CCA RATIO:

ECA:  41 cm/sec

RIGHT CAROTID ARTERY: There is a minimal amount of mixed echogenic
plaque within the right carotid bulb (image 16), extending to
involve the origin and proximal aspects of the right internal
carotid artery (image 23), not resulting in elevated peak systolic
velocities within the interrogated course of the right internal
carotid artery to suggest a hemodynamically significant stenosis.

RIGHT VERTEBRAL ARTERY:  Antegrade flow

LEFT CAROTID ARTERY: There is a minimal amount of mixed echogenic
plaque involving the origin and proximal aspects of the left
internal carotid artery (image 55), not resulting in elevated peak
systolic velocities within the interrogated course of the left
internal carotid artery to suggest a hemodynamically significant
stenosis.

LEFT VERTEBRAL ARTERY:  Antegrade flow

Note is made of an apparent cardiac arrhythmia (representative image
28).
IMPRESSION: 1. Minimal amount of bilateral atherosclerotic plaque, right greater
than left, not resulting in a hemodynamically significant stenosis
within either internal carotid artery.
2. Apparent cardiac arrhythmia. Further evaluation with ECG
monitoring could be performed as indicated.

## 2018-06-15 IMAGING — CR DG LUMBAR SPINE 2-3V
3 series · 3 of 3 positions shown · non-contrast
Comparison: No prior.

CLINICAL DATA: Fall.

EXAM:
LUMBAR SPINE - 2-3 VIEW

[l-spine ap]
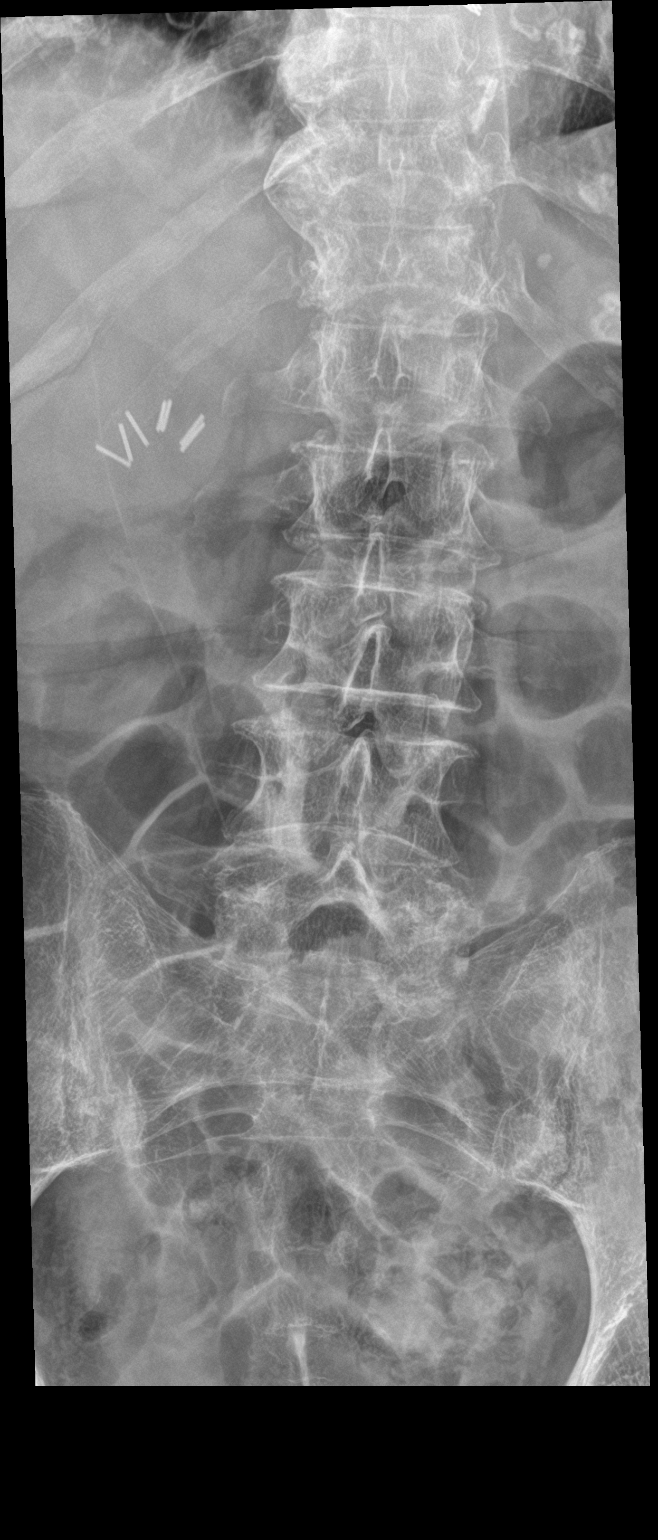

[l-spine lat]
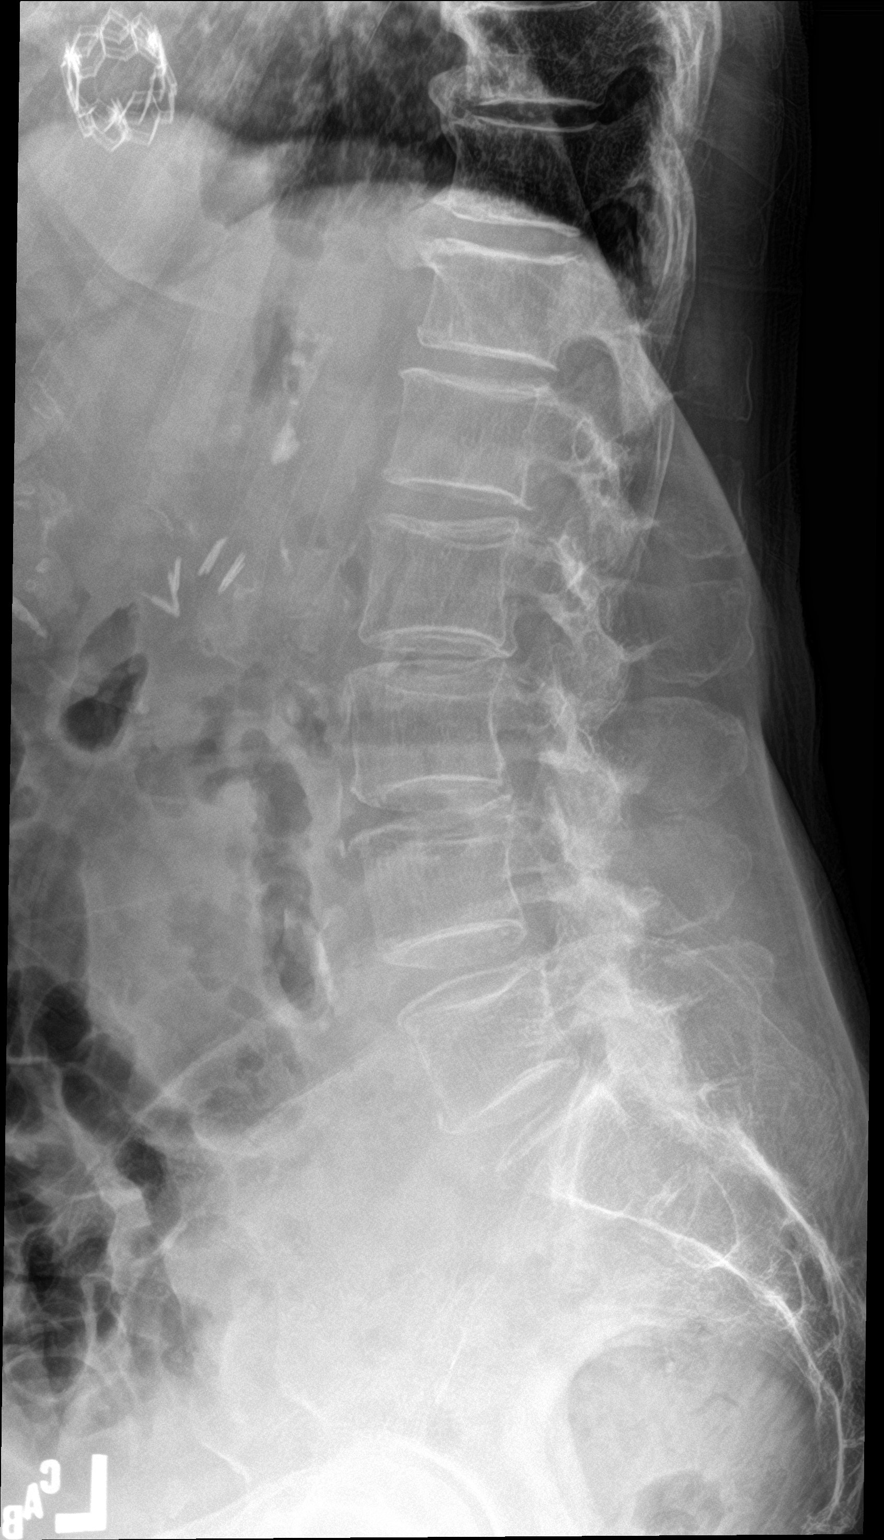

[l-spine spot]
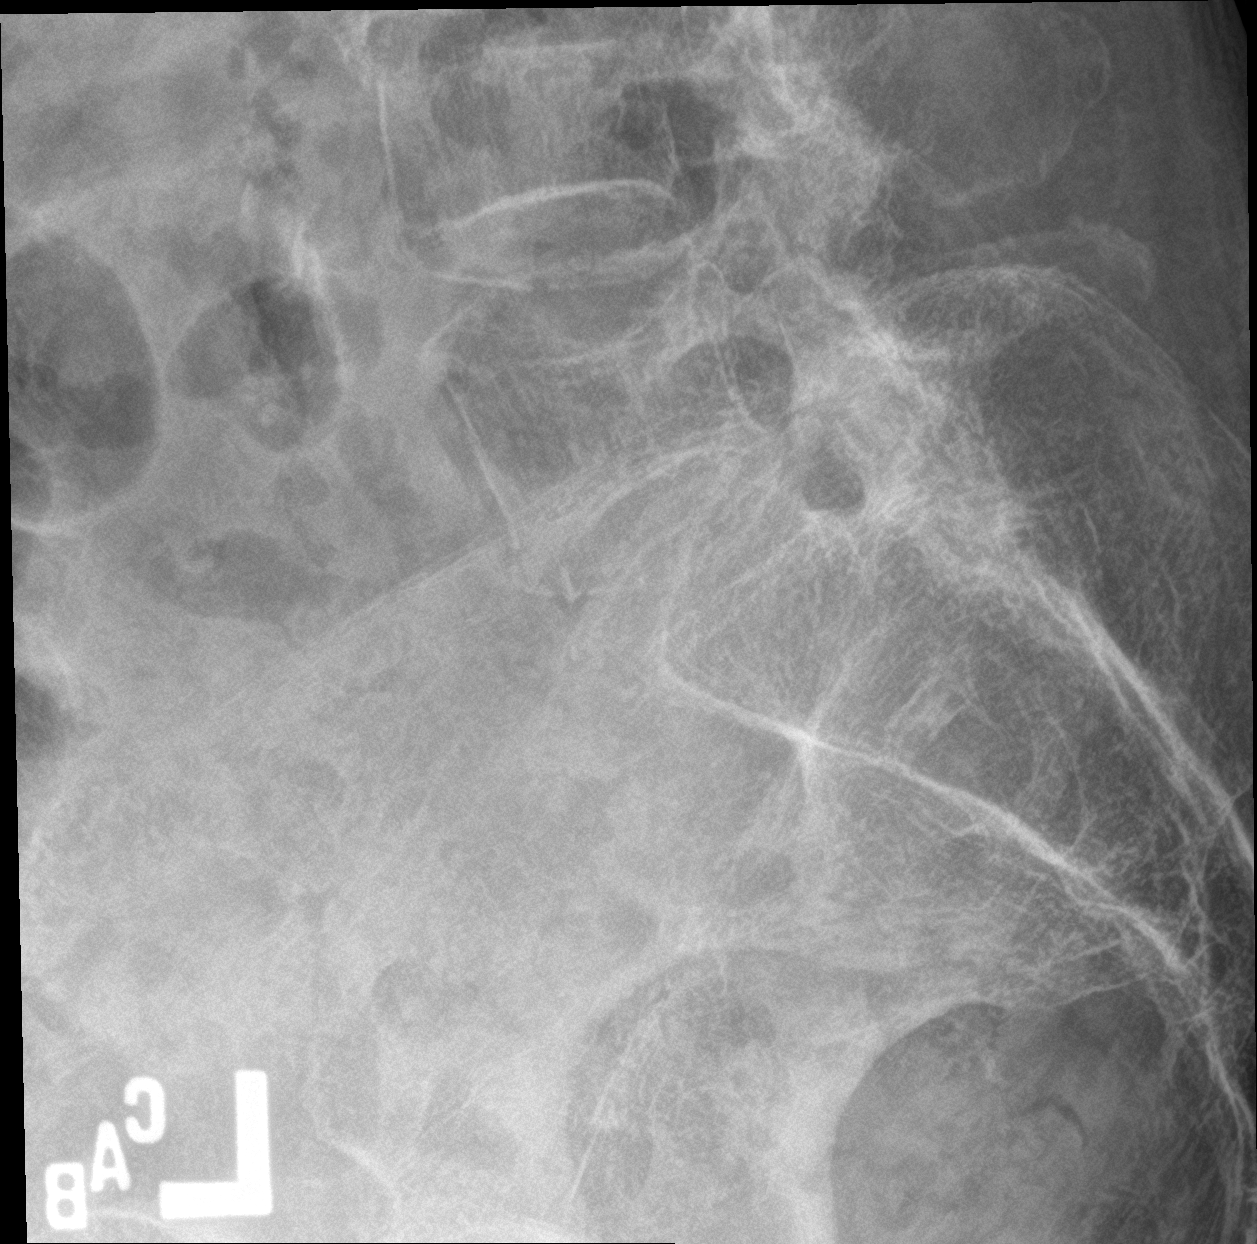

[3 of 3 positions shown; findings below may reference images not displayed]

FINDINGS: Surgical clips right upper quadrant . Coarse calcifications in the
left upper quadrant possibly in the adrenal. Diffuse osteopenia.
Lumbar spine scoliosis concave right. Diffuse degenerative change.
No acute bony abnormality identified.Tiny sclerotic focus in the
left sacrum possibly a bone island. Aortoiliac atherosclerotic
vascular calcification
IMPRESSION: 1. Mild scoliosis concave right. Diffuse osteopenia degenerative
change. No acute bony abnormality identified.

2. Aortoiliac atherosclerotic vascular disease.

## 2018-06-15 IMAGING — DX DG CHEST 1V
1 series · 1 of 1 positions shown · non-contrast
Comparison: None.

CLINICAL DATA: Status post fall.  Back pain.

EXAM:
CHEST 1 VIEW

[chest ap]
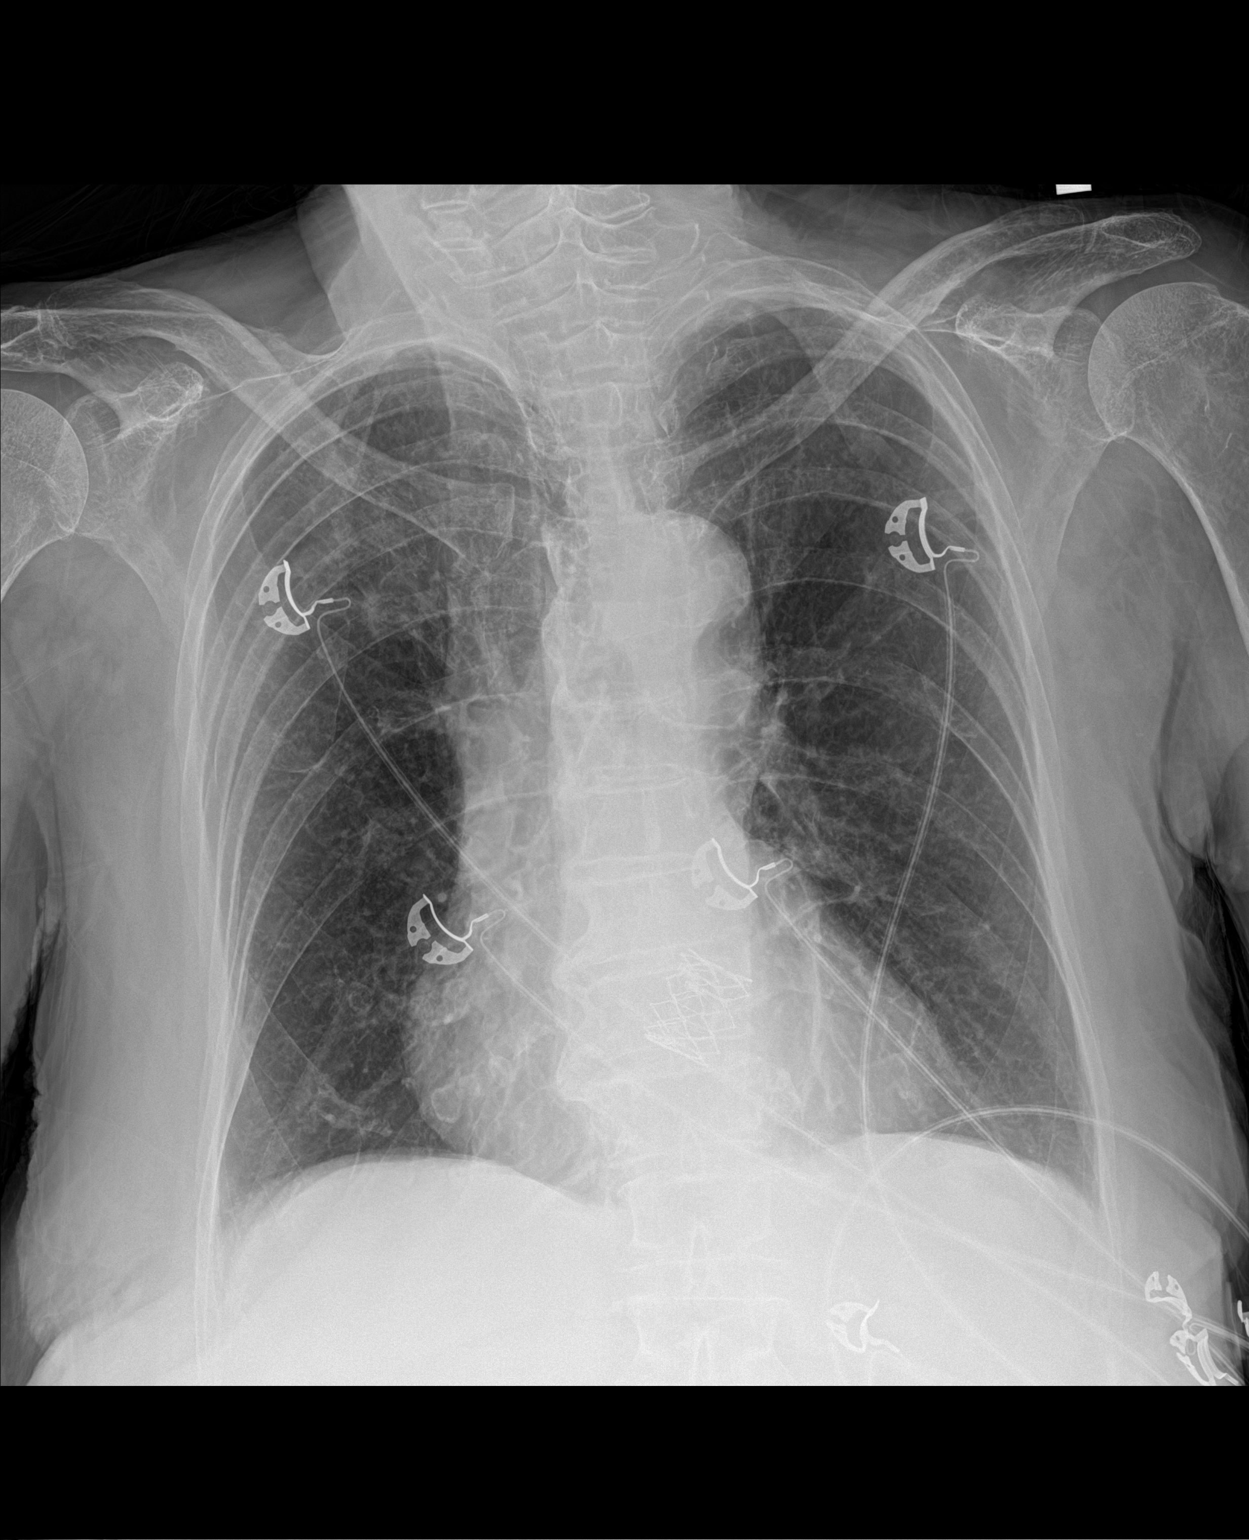

[1 of 1 positions shown; findings below may reference images not displayed]

FINDINGS: The heart size and mediastinal contours are within normal limits.
Prior TAVR. Both lungs are clear. The visualized skeletal structures
are unremarkable.
IMPRESSION: No active disease.

## 2018-07-19 ENCOUNTER — Ambulatory Visit: Payer: Medicare Other | Admitting: Urology
